# Patient Record
Sex: Male | Born: 2000 | Hispanic: No | Marital: Single | Smoking: Never smoker
Health system: Southern US, Community
[De-identification: ages and names within clinical notes are randomized; demographics above are authoritative.]

## PROBLEM LIST (undated history)

## (undated) DIAGNOSIS — F419 Anxiety disorder, unspecified: Secondary | ICD-10-CM

## (undated) DIAGNOSIS — T7840XA Allergy, unspecified, initial encounter: Secondary | ICD-10-CM

## (undated) DIAGNOSIS — K602 Anal fissure, unspecified: Secondary | ICD-10-CM

## (undated) HISTORY — DX: Anal fissure, unspecified: K60.2

## (undated) HISTORY — DX: Anxiety disorder, unspecified: F41.9

## (undated) HISTORY — PX: WISDOM TOOTH EXTRACTION: SHX21

## (undated) HISTORY — DX: Allergy, unspecified, initial encounter: T78.40XA

---
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
## 2020-05-09 ENCOUNTER — Encounter (HOSPITAL_COMMUNITY): Payer: Self-pay | Admitting: Pharmacy Technician

## 2020-05-09 ENCOUNTER — Emergency Department (HOSPITAL_COMMUNITY)
Admission: EM | Admit: 2020-05-09 | Discharge: 2020-05-10 | Disposition: A | Discharge status: Home / Self Care | Payer: Medicaid Other | Attending: Emergency Medicine | Admitting: Emergency Medicine

## 2020-05-09 ENCOUNTER — Other Ambulatory Visit: Payer: Self-pay

## 2020-05-09 DIAGNOSIS — Z79899 Other long term (current) drug therapy: Secondary | ICD-10-CM | POA: Insufficient documentation

## 2020-05-09 DIAGNOSIS — K2980 Duodenitis without bleeding: Secondary | ICD-10-CM | POA: Diagnosis not present

## 2020-05-09 DIAGNOSIS — K602 Anal fissure, unspecified: Secondary | ICD-10-CM | POA: Diagnosis not present

## 2020-05-09 DIAGNOSIS — R1084 Generalized abdominal pain: Secondary | ICD-10-CM | POA: Diagnosis present

## 2020-05-09 LAB — COMPREHENSIVE METABOLIC PANEL
ALT: 22 U/L (ref 0–44)
AST: 28 U/L (ref 15–41)
Albumin: 4.5 g/dL (ref 3.5–5.0)
Alkaline Phosphatase: 80 U/L (ref 38–126)
Anion gap: 10 (ref 5–15)
BUN: 12 mg/dL (ref 6–20)
CO2: 27 mmol/L (ref 22–32)
Calcium: 9.6 mg/dL (ref 8.9–10.3)
Chloride: 102 mmol/L (ref 98–111)
Creatinine, Ser: 0.94 mg/dL (ref 0.61–1.24)
GFR, Estimated: >60 mL/min (ref >60)
Glucose, Bld: 96 mg/dL (ref 70–99)
Potassium: 3.6 mmol/L (ref 3.5–5.1)
Sodium: 139 mmol/L (ref 135–145)
Total Bilirubin: 0.9 mg/dL (ref 0.3–1.2)
Total Protein: 7 g/dL (ref 6.5–8.1)

## 2020-05-09 LAB — CBC
HCT: 45.7 % (ref 39.0–52.0)
Hemoglobin: 15.6 g/dL (ref 13.0–17.0)
MCH: 30.8 pg (ref 26.0–34.0)
MCHC: 34.1 g/dL (ref 30.0–36.0)
MCV: 90.3 fL (ref 80.0–100.0)
Platelets: 172 10*3/uL (ref 150–400)
RBC: 5.06 MIL/uL (ref 4.22–5.81)
RDW: 12.3 % (ref 11.5–15.5)
WBC: 6.5 10*3/uL (ref 4.0–10.5)
nRBC: 0 % (ref 0.0–0.2)

## 2020-05-09 LAB — TYPE AND SCREEN
ABO/RH(D): B POS
Antibody Screen: NEGATIVE

## 2020-05-09 LAB — LIPASE, BLOOD: Lipase: 36 U/L (ref 11–51)

## 2020-05-09 NOTE — ED Triage Notes (Signed)
Pt here with reports of generalized abdominal pain all week. Pt endorses diarrhea with blood in it. Pt denies N/V or fevers.

## 2020-05-10 ENCOUNTER — Emergency Department (HOSPITAL_COMMUNITY): Payer: Medicaid Other

## 2020-05-10 MED ORDER — FAMOTIDINE 20 MG PO TABS: 20.0000 mg | ORAL_TABLET | Freq: Two times a day (BID) | ORAL | 0 refills | Status: DC

## 2020-05-10 MED ORDER — PANTOPRAZOLE SODIUM 40 MG PO TBEC
40.0000 mg | DELAYED_RELEASE_TABLET | Freq: Once | ORAL | Status: AC
  Administered 2020-05-10: 40 mg via ORAL
  Filled 2020-05-10: qty 1

## 2020-05-10 MED ORDER — FAMOTIDINE 20 MG PO TABS
20.0000 mg | ORAL_TABLET | Freq: Once | ORAL | Status: AC
  Administered 2020-05-10: 20 mg via ORAL
  Filled 2020-05-10: qty 1

## 2020-05-10 MED ORDER — PANTOPRAZOLE SODIUM 20 MG PO TBEC: 20.0000 mg | DELAYED_RELEASE_TABLET | Freq: Every day | ORAL | 0 refills | Status: DC

## 2020-05-10 MED ORDER — DOCUSATE SODIUM 100 MG PO CAPS: 100.0000 mg | ORAL_CAPSULE | Freq: Two times a day (BID) | ORAL | 0 refills | Status: DC

## 2020-05-10 MED ORDER — IOHEXOL 300 MG/ML  SOLN
100.0000 mL | Freq: Once | INTRAMUSCULAR | Status: AC | PRN
  Administered 2020-05-10: 100 mL via INTRAVENOUS

## 2020-05-10 NOTE — ED Provider Notes (Signed)
[facility] EMERGENCY DEPARTMENT Provider Note   CSN: [id number] Arrival date & time: 05/09/20  1842     History Chief Complaint  Patient presents with  . Abdominal Pain    James Phillips- Prudy Feeler is a 20 y.o. male.  20 yo M w/ 1 week of generalized abdominal pain.  Patient states that he also had some blood-streaked stools during that time.  But today the abdominal pain got slightly worse but more significantly had a bowel movement that was all blood.  States it is bright red.  No clots.  States he has a bit of rectal discomfort with bowel movements but no other associated issues.  He states he had constipation a few years ago saw GI doctor right appears x-rays were done, but no source was found.  Patient with no other history of this.  Patient is sexually active with women and does not have anal intercourse.  Patient states his constipation symptoms seem to have improved. No longer supplements fiber/miralax/etc.   Abdominal Pain      History reviewed. No pertinent past medical history.  There are no problems to display for this patient.   Past Surgical History:  Procedure Laterality Date  . WISDOM TOOTH EXTRACTION         No family history on file.  Social History   Tobacco Use  . Smoking status: Never Smoker  Substance Use Topics  . Alcohol use: Never  . Drug use: Never    Home Medications Prior to Admission medications   Medication Sig Start Date End Date Taking? Authorizing Provider  Calcium Carbonate-Vitamin D (CALCIUM-D PO) Take 1 tablet by mouth daily.   Yes [provider]  docusate sodium (COLACE) 100 MG capsule Take 1 capsule (100 mg total) by mouth every 12 (twelve) hours. 05/10/20  Yes Margeret Stachnik, Barbara Cower, MD  famotidine (PEPCID) 20 MG tablet Take 1 tablet (20 mg total) by mouth 2 (two) times daily. 05/10/20  Yes Luella Gardenhire, Barbara Cower, MD  pantoprazole (PROTONIX) 20 MG tablet Take 1 tablet (20 mg total) by mouth daily. 05/10/20  Yes Berneita Sanagustin,  Barbara Cower, MD  ibuprofen (MOTRIN IB) 200 MG tablet Take 3 tablets (600 mg total) by mouth every 8 (eight) hours as needed. 07/14/15   Burroughs, Cherre Robins, MD    Allergies    Copper-containing compounds  Review of Systems   Review of Systems  Gastrointestinal: Positive for abdominal pain.  All other systems reviewed and are negative.   Physical Exam Updated Vital Signs BP 129/78   Pulse 71   Temp 98.9 F (37.2 C)   Resp 18   SpO2 96%   Physical Exam Vitals and nursing note reviewed. Exam conducted with a chaperone present.  Constitutional:      Appearance: He is well-developed and well-nourished.  HENT:     Head: Normocephalic and atraumatic.     Mouth/Throat:     Mouth: Mucous membranes are moist.  Eyes:     Pupils: Pupils are equal, round, and reactive to light.  Cardiovascular:     Rate and Rhythm: Normal rate.  Pulmonary:     Effort: Pulmonary effort is normal. No respiratory distress.  Abdominal:     General: Abdomen is flat. There is no distension.  Genitourinary:    Rectum: Anal fissure (small, no e/o recent bleeding) present. No tenderness or external hemorrhoid.     Comments: Chaperoned by nurse tech Musculoskeletal:        General: Normal range of motion.  Cervical back: Normal range of motion.  Skin:    General: Skin is warm and dry.  Neurological:     General: No focal deficit present.     Mental Status: He is alert.     ED Results / Procedures / Treatments   Labs (all labs ordered are listed, but only abnormal results are displayed) Labs Reviewed  COMPREHENSIVE METABOLIC PANEL  CBC  LIPASE, BLOOD  POC OCCULT BLOOD, ED  TYPE AND SCREEN    EKG None  Radiology CT ABDOMEN PELVIS W CONTRAST  Result Date: 05/10/2020 CLINICAL DATA:  Diffuse abdominal pain, diarrhea, hematochezia EXAM: CT ABDOMEN AND PELVIS WITH CONTRAST TECHNIQUE: Multidetector CT imaging of the abdomen and pelvis was performed using the standard protocol following bolus  administration of intravenous contrast. CONTRAST:  OMNIPAQUE IOHEXOL 300 MG/ML  SOLN COMPARISON:  02/16/2017 FINDINGS: Lower chest: The visualized lung bases are clear bilaterally. The visualized heart and pericardium are unremarkable. Hepatobiliary: No focal liver abnormality is seen. No gallstones, gallbladder wall thickening, or biliary dilatation. Pancreas: Unremarkable Spleen: Unremarkable Adrenals/Urinary Tract: Adrenal glands are unremarkable. Kidneys are normal, without renal calculi, focal lesion, or hydronephrosis. Bladder is unremarkable. Stomach/Bowel: There is circumferential thickening and mild periduodenal inflammatory stranding involving the third and fourth portion of the duodenum as well as the proximal jejunum in the region of the ligament of Treitz compatible with changes of infectious or inflammatory duodenitis. No evidence of obstruction. No perforation. The stomach, small bowel, and large bowel are otherwise unremarkable. Appendix normal. No free intraperitoneal gas or fluid. Vascular/Lymphatic: No significant vascular findings are present. No enlarged abdominal or pelvic lymph nodes. Reproductive: Prostate is unremarkable. Other: Tiny fat containing umbilical hernia. The rectum is unremarkable. Musculoskeletal: The osseous structures are unremarkable. IMPRESSION: Circumferential thickening and mild periduodenal inflammatory stranding in keeping with changes of infectious or inflammatory duodenitis. No evidence of obstruction or perforation. Electronically Signed   By: Helyn Numbers MD   On: 05/10/2020 03:17    Procedures Procedures   Medications Ordered in ED Medications  iohexol (OMNIPAQUE) 300 MG/ML solution 100 mL (100 mLs Intravenous Contrast Given 05/10/20 0305)  pantoprazole (PROTONIX) EC tablet 40 mg (40 mg Oral Given 05/10/20 0428)  famotidine (PEPCID) tablet 20 mg (20 mg Oral Given 05/10/20 8841)    ED Course  I have reviewed the triage vital signs and the nursing  notes.  Pertinent labs & imaging results that were available during my care of the patient were reviewed by me and considered in my medical decision making (see chart for details).    MDM Rules/Calculators/A&P                         Will get ct to ensure no e/o IBD.   Ct w/ possible duodenitis. No other abnormalities. Will treat for duodenitis and anal fissure. GI fu recommended.   Final Clinical Impression(s) / ED Diagnoses Final diagnoses:  Duodenitis  Anal fissure    Rx / DC Orders ED Discharge Orders         Ordered    pantoprazole (PROTONIX) 20 MG tablet  Daily        05/10/20 0420    famotidine (PEPCID) 20 MG tablet  2 times daily        05/10/20 0420    docusate sodium (COLACE) 100 MG capsule  Every 12 hours        05/10/20 0420           Reginae Wolfrey,  Barbara Cower, MD 05/10/20 [id number]

## 2020-06-26 ENCOUNTER — Emergency Department (HOSPITAL_COMMUNITY): Payer: Medicaid Other

## 2020-06-26 ENCOUNTER — Other Ambulatory Visit: Payer: Self-pay

## 2020-06-26 ENCOUNTER — Emergency Department (HOSPITAL_COMMUNITY)
Admission: EM | Admit: 2020-06-26 | Discharge: 2020-06-26 | Disposition: A | Discharge status: Home / Self Care | Payer: Medicaid Other | Attending: Emergency Medicine | Admitting: Emergency Medicine

## 2020-06-26 ENCOUNTER — Encounter (HOSPITAL_COMMUNITY): Payer: Self-pay

## 2020-06-26 DIAGNOSIS — M546 Pain in thoracic spine: Secondary | ICD-10-CM | POA: Insufficient documentation

## 2020-06-26 DIAGNOSIS — R059 Cough, unspecified: Secondary | ICD-10-CM | POA: Insufficient documentation

## 2020-06-26 MED ORDER — NAPROXEN 500 MG PO TABS: 500.0000 mg | ORAL_TABLET | Freq: Two times a day (BID) | ORAL | 0 refills | Status: DC

## 2020-06-26 MED ORDER — METHOCARBAMOL 500 MG PO TABS: 1000.0000 mg | ORAL_TABLET | Freq: Four times a day (QID) | ORAL | 0 refills | Status: DC

## 2020-06-26 NOTE — ED Triage Notes (Signed)
Emergency Medicine Provider Triage Evaluation Note  Piotr Christopher , a 20 y.o. male  was evaluated in triage.  Pt complains of SHOB, back pain. Onset a week ago, progressively worsening.  Review of Systems  Positive: Back pain, SHOB Negative: CP, numbness, weakness  Physical Exam  BP 131/80 (BP Location: Left Arm)   Pulse 73   Temp 99.1 F (37.3 C)   Resp 17   SpO2 99%  Gen:   Awake, no distress   HEENT:  Atraumatic  Resp:  Normal effort  Cardiac:  Normal rate  Abd:   Nondistended, nontender  MSK:   Moves extremities without difficulty, TTP thoracic paraspinous right and left Neuro:  Speech clear   Medical Decision Making  Medically screening exam initiated at 4:42 PM.  Appropriate orders placed.  Kirby Cortese was informed that the remainder of the evaluation will be completed by another provider, this initial triage assessment does not replace that evaluation, and the importance of remaining in the ED until their evaluation is complete.  Clinical Impression     Jeannie Fend, PA-C 06/26/20 1643

## 2020-06-26 NOTE — ED Triage Notes (Signed)
Pt c/o back pain that worsens with movement. Pt states "it feels like someone is pushing on my chest". Pt c/o shortness of breath. Denies nausea, vomiting, or cough. Symptoms started 11 days ago.

## 2020-06-26 NOTE — ED Provider Notes (Signed)
[facility] EMERGENCY DEPARTMENT Provider Note   CSN: [id number] Arrival date & time: 06/26/20  1631     History Chief Complaint  Patient presents with  . Back Pain    James Phillips is a 20 y.o. male.  Patient presents to the emergency department today for evaluation of mid back soreness and a sensation of pressure like his back is pressing into his lungs.  Symptoms started about a week ago.  This was after he had an illness with coughing 1 to 2 weeks prior.  He denies acute injuries.  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  Denies wheezing, current cough or fevers.  He has been able to do normal activities.  Patient denies warning symptoms of back pain including: fecal incontinence, urinary retention or overflow incontinence, night sweats, waking from sleep with back pain, unexplained fevers or weight loss, h/o cancer, IVDU, recent trauma.  Patient denies risk factors for pulmonary embolism including: unilateral leg swelling, history of DVT/PE/other blood clots, use of exogenous hormones, recent immobilizations, recent surgery, recent travel (>4hr segment), malignancy, hemoptysis.          History reviewed. No pertinent past medical history.  There are no problems to display for this patient.   Past Surgical History:  Procedure Laterality Date  . WISDOM TOOTH EXTRACTION         History reviewed. No pertinent family history.  Social History   Tobacco Use  . Smoking status: Never Smoker  . Smokeless tobacco: Never Used  Vaping Use  . Vaping Use: Never used  Substance Use Topics  . Alcohol use: Not Currently  . Drug use: Yes    Types: Marijuana    Comment: occasionally    Home Medications Prior to Admission medications   Medication Sig Start Date End Date Taking? Authorizing Provider  Calcium Carbonate-Vitamin D (CALCIUM-D PO) Take 1 tablet by mouth daily.    [provider]  docusate sodium (COLACE) 100 MG capsule Take 1  capsule (100 mg total) by mouth every 12 (twelve) hours. 05/10/20   Mesner, Barbara Cower, MD  famotidine (PEPCID) 20 MG tablet Take 1 tablet (20 mg total) by mouth 2 (two) times daily. 05/10/20   Mesner, Barbara Cower, MD  ibuprofen (MOTRIN IB) 200 MG tablet Take 3 tablets (600 mg total) by mouth every 8 (eight) hours as needed. 07/14/15   Burroughs, Cherre Robins, MD  pantoprazole (PROTONIX) 20 MG tablet Take 1 tablet (20 mg total) by mouth daily. 05/10/20   Mesner, Barbara Cower, MD    Allergies    Copper-containing compounds  Review of Systems   Review of Systems  Constitutional: Negative for fever and unexpected weight change.  Respiratory: Positive for shortness of breath. Negative for cough, chest tightness and wheezing.   Cardiovascular: Negative for chest pain and leg swelling.  Gastrointestinal: Negative for constipation.       Neg for fecal incontinence  Genitourinary: Negative for difficulty urinating, flank pain and hematuria.       Negative for urinary incontinence or retention  Musculoskeletal: Positive for back pain.  Neurological: Negative for weakness and numbness.       Negative for saddle paresthesias     Physical Exam Updated Vital Signs BP 131/80 (BP Location: Left Arm)   Pulse 73   Temp 99.1 F (37.3 C)   Resp 17   SpO2 99%   Physical Exam Vitals and nursing note reviewed.  Constitutional:      Appearance: He is well-developed.  HENT:  Head: Normocephalic and atraumatic.  Eyes:     Conjunctiva/sclera: Conjunctivae normal.  Abdominal:     Palpations: Abdomen is soft.     Tenderness: There is no abdominal tenderness.  Musculoskeletal:        General: Normal range of motion.     Cervical back: Normal range of motion. No tenderness. Normal range of motion.     Thoracic back: Tenderness (Paraspinous) present. No bony tenderness.     Lumbar back: Tenderness (paraspinous) present. No bony tenderness.     Comments: No step-off noted with palpation of spine.   Skin:    General:  Skin is warm and dry.  Neurological:     Mental Status: He is alert.     Sensory: No sensory deficit.     Motor: No abnormal muscle tone.     Deep Tendon Reflexes: Reflexes are normal and symmetric.     Comments: 5/5 strength in entire lower extremities bilaterally. No sensation deficit.      ED Results / Procedures / Treatments   Labs (all labs ordered are listed, but only abnormal results are displayed) Labs Reviewed - No data to display  EKG EKG Interpretation  Date/Time:  Thursday June 26 2020 16:41:10 EDT Ventricular Rate:  73 PR Interval:  162 QRS Duration: 98 QT Interval:  352 QTC Calculation: 387 R Axis:   90 Text Interpretation: Normal sinus rhythm Rightward axis Borderline ECG Confirmed by Kristine Royal [phone]) on 06/26/2020 6:04:11 PM   Radiology DG Chest 2 View  Result Date: 06/26/2020 CLINICAL DATA:  Shortness of breath EXAM: CHEST - 2 VIEW COMPARISON:  02/16/2017 FINDINGS: The heart size and mediastinal contours are within normal limits. Both lungs are clear. The visualized skeletal structures are unremarkable. IMPRESSION: Normal study. Electronically Signed   By: Charlett Nose M.D.   On: 06/26/2020 17:34    Procedures Procedures   Medications Ordered in ED Medications - No data to display  ED Course  I have reviewed the triage vital signs and the nursing notes.  Pertinent labs & imaging results that were available during my care of the patient were reviewed by me and considered in my medical decision making (see chart for details).  7:00 PM Patient seen and examined. Reviewed CXR and EKG.   Vital signs reviewed and are as follows: Vitals:   06/26/20 1638  BP: 131/80  Pulse: 73  Resp: 17  Temp: 99.1 F (37.3 C)  SpO2: 99%    Patient counseled on proper use of muscle relaxant medication.  They were told not to drink alcohol, drive any vehicle, or do any dangerous activities while taking this medication.  Patient verbalized  understanding.  Patient urged to follow-up with PCP if pain does not improve with treatment and rest or if pain becomes recurrent. Urged to return with worsening severe pain, loss of bowel or bladder control, trouble walking.   The patient verbalizes understanding and agrees with the plan.    MDM Rules/Calculators/A&P                          Patient with back pain and some respiratory symptoms.  Chest x-ray, EKG, vital signs are all reassuring.  Pain does seem reproducible with movement and position.  Seems musculoskeletal in nature.  No red flags for low back pain or for DVT/PE.  Patient is in no distress.  We will treat conservatively as above.  Encourage PCP follow-up if not improving.  Final Clinical  Impression(s) / ED Diagnoses Final diagnoses:  Acute bilateral thoracic back pain    Rx / DC Orders ED Discharge Orders         Ordered    methocarbamol (ROBAXIN) 500 MG tablet  4 times daily        06/26/20 1903    naproxen (NAPROSYN) 500 MG tablet  2 times daily        06/26/20 1903           Renne Crigler, Cordelia Poche 06/26/20 1904    Wynetta Fines, MD 06/26/20 2317

## 2020-06-26 NOTE — Discharge Instructions (Signed)
Please read and follow all provided instructions.  Your diagnoses today include:  1. Acute bilateral thoracic back pain     Tests performed today include:  Vital signs - see below for your results today  Medications prescribed:   Robaxin (methocarbamol) - muscle relaxer medication  DO NOT drive or perform any activities that require you to be awake and alert because this medicine can make you drowsy.    Naproxen - anti-inflammatory pain medication  Do not exceed 500mg  naproxen every 12 hours, take with food  You have been prescribed an anti-inflammatory medication or NSAID. Take with food. Take smallest effective dose for the shortest duration needed for your pain. Stop taking if you experience stomach pain or vomiting.   Take any prescribed medications only as directed.  Home care instructions:   Follow any educational materials contained in this packet  Please rest, use ice or heat on your back for the next several days  Do not lift, push, pull anything more than 10 pounds for the next week  Follow-up instructions: Please follow-up with your primary care provider in the next 1 week for further evaluation of your symptoms.   Return instructions:  SEEK IMMEDIATE MEDICAL ATTENTION IF YOU HAVE:  New numbness, tingling, weakness, or problem with the use of your arms or legs  Severe back pain not relieved with medications  Loss control of your bowels or bladder  Increasing pain in any areas of the body (such as chest or abdominal pain)  Shortness of breath, dizziness, or fainting.   Worsening nausea (feeling sick to your stomach), vomiting, fever, or sweats  Any other emergent concerns regarding your health   Additional Information:  Your vital signs today were: BP 131/80 (BP Location: Left Arm)   Pulse 73   Temp 99.1 F (37.3 C)   Resp 17   SpO2 99%  If your blood pressure (BP) was elevated above 135/85 this visit, please have this repeated by your doctor  within one month. --------------
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
## 2020-10-16 ENCOUNTER — Other Ambulatory Visit: Payer: Self-pay

## 2020-10-16 ENCOUNTER — Ambulatory Visit (INDEPENDENT_AMBULATORY_CARE_PROVIDER_SITE_OTHER): Payer: Medicaid Other | Admitting: Family Medicine

## 2020-10-16 ENCOUNTER — Encounter: Payer: Self-pay | Admitting: Family Medicine

## 2020-10-16 ENCOUNTER — Ambulatory Visit (INDEPENDENT_AMBULATORY_CARE_PROVIDER_SITE_OTHER): Payer: Medicaid Other

## 2020-10-16 VITALS — BP 120/76 | HR 66 | Ht 70.0 in | Wt 204.8 lb

## 2020-10-16 DIAGNOSIS — G8929 Other chronic pain: Secondary | ICD-10-CM | POA: Diagnosis not present

## 2020-10-16 DIAGNOSIS — M545 Low back pain, unspecified: Secondary | ICD-10-CM | POA: Diagnosis not present

## 2020-10-16 DIAGNOSIS — Z1159 Encounter for screening for other viral diseases: Secondary | ICD-10-CM

## 2020-10-16 DIAGNOSIS — Z114 Encounter for screening for human immunodeficiency virus [HIV]: Secondary | ICD-10-CM | POA: Diagnosis present

## 2020-10-16 DIAGNOSIS — Z23 Encounter for immunization: Secondary | ICD-10-CM

## 2020-10-16 NOTE — Assessment & Plan Note (Signed)
No sciatica.  No midline tenderness.  Patient not in any pain at this time but normally experiences pain at the upper lumbar region in the musculature more on the right side.  He has been getting good benefit from lidocaine patches.  He is interested in learning some strengthening exercises to help improve the area as this has bothered him for 3 or 4 years.  We will place referral for physical therapy.  Patient plans to asked them to ensure that he will not have much expense for physical therapy.  If this is the case he is going to let me know and I can demonstrate additional exercises that he may try.  Follow-up as needed

## 2020-10-16 NOTE — Patient Instructions (Signed)
Will screen for HIV and hepatitis C as is recommended.  Will provide referral for physical therapy.  If you have any difficulty going to physical therapy or prefer other means or have any other issues with your back please let us know.

## 2020-10-17 LAB — HCV AB W REFLEX TO QUANT PCR: HCV Ab: <0.1 s/co ratio (ref 0.0–0.9)

## 2020-10-17 LAB — HCV INTERPRETATION

## 2020-10-17 LAB — HIV ANTIBODY (ROUTINE TESTING W REFLEX): HIV Screen 4th Generation wRfx: NONREACTIVE

## 2020-10-24 ENCOUNTER — Ambulatory Visit: Payer: Medicaid Other | Admitting: Physical Therapy

## 2020-10-25 ENCOUNTER — Encounter: Payer: Self-pay | Admitting: Physical Therapy

## 2020-10-25 ENCOUNTER — Other Ambulatory Visit: Payer: Self-pay

## 2020-10-25 ENCOUNTER — Ambulatory Visit: Payer: Medicaid Other | Attending: Family Medicine | Admitting: Physical Therapy

## 2020-10-25 DIAGNOSIS — M545 Low back pain, unspecified: Secondary | ICD-10-CM | POA: Diagnosis not present

## 2020-10-25 DIAGNOSIS — G8929 Other chronic pain: Secondary | ICD-10-CM | POA: Insufficient documentation

## 2020-10-25 DIAGNOSIS — M62830 Muscle spasm of back: Secondary | ICD-10-CM | POA: Insufficient documentation
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
## 2020-10-31 ENCOUNTER — Ambulatory Visit: Payer: Medicaid Other | Admitting: Physical Therapy

## 2020-11-07 ENCOUNTER — Ambulatory Visit: Payer: Medicaid Other | Admitting: Physical Therapy

## 2020-11-15 ENCOUNTER — Other Ambulatory Visit: Payer: Self-pay

## 2020-11-15 ENCOUNTER — Encounter: Payer: Self-pay | Admitting: Physical Therapy

## 2020-11-15 ENCOUNTER — Ambulatory Visit: Payer: Medicaid Other | Attending: Family Medicine | Admitting: Physical Therapy

## 2020-11-15 DIAGNOSIS — G8929 Other chronic pain: Secondary | ICD-10-CM | POA: Insufficient documentation

## 2020-11-15 DIAGNOSIS — M545 Low back pain, unspecified: Secondary | ICD-10-CM | POA: Diagnosis present

## 2020-11-15 DIAGNOSIS — M62830 Muscle spasm of back: Secondary | ICD-10-CM | POA: Diagnosis present

## 2020-11-15 NOTE — Therapy (Signed)
[facility] [address] [place], [place], [address] Phone: [phone]   Fax:  [phone]  Physical Therapy Treatment  Patient Details  Name: James Phillips MRN: [id number] Date of Birth: 11/22/2000 Referring Provider (PT): Carney Living, MD   Encounter Date: 11/15/2020   PT End of Session - 11/15/20 0905     Visit Number 2    Number of Visits 7    Date for PT Re-Evaluation 12/06/20    Authorization Type UHC managed MCD    PT Start Time 0902    PT Stop Time 0943    PT Time Calculation (min) 41 min    Activity Tolerance Patient tolerated treatment well    Behavior During Therapy [facility] for tasks assessed/performed             History reviewed. No pertinent past medical history.  Past Surgical History:  Procedure Laterality Date   WISDOM TOOTH EXTRACTION      There were no vitals filed for this visit.   Subjective Assessment - 11/15/20 0905     Subjective "I am doing the exercises and the pain is getting better today at a dull 3/10 and the motion is improving."    Diagnostic tests 02/16/2017 IMPRESSION:  Negative exam.    Patient Stated Goals To be able to do anything I want without being limited    Currently in Pain? Yes    Pain Score 3     Pain Location Back    Pain Orientation Right    Pain Descriptors / Indicators Aching    Pain Type Chronic pain    Pain Onset More than a month ago    Pain Frequency Intermittent    Aggravating Factors  prolonged standing    Pain Relieving Factors bending forward and stretching                               [facility] Adult PT Treatment/Exercise:  Therapeutic Exercise: Elliptical L1 ramp L1 x 5 min Hamstring stretch 3 x 30 sec PNF contract/ relax SLR with quad set 2 x 12 - (cues to keep ankle DF) R Low back stretch SKTC 2 x 30 sec Lower trunk rotation x 20  Dead bug 1 x 10 holding 10 sec - (tactile cues for proper form) Seated on  green physioball - Anterior pelvic tilt with abdominal draw in maneuver 1 x 10 holding 5 second Seated marching alternating L/R 1 x 20 Palloff press 2 x 12 BTB bil   Manual Therapy:  LAD LLE grade V with cavitation noted Trigger point release along the  Rlumbar paraspinals ( how to use a ball against the wall)   NOT PERFORMED TODAY:          PT Education - 11/15/20 0924     Education Details Reviewed and updated HEP to include dead bug and SKTC stretch. reviewed posture in sitting and beneifts of positoining.              PT Short Term Goals - 10/25/20 5009       PT SHORT TERM GOAL #1   Title pt to be IND with inital HEP    Baseline no previous HEP    Time 3    Period Weeks    Status New    Target Date 11/15/20               PT Long Term Goals -  10/25/20 0854       PT LONG TERM GOAL #1   Title pt to maintain his functional trunk mobility but noting no pain in all planes for improvement of condition    Baseline see flowsheet    Time 6    Period Weeks    Status New    Target Date 12/06/20      PT LONG TERM GOAL #2   Title pt to be able to verbalize/ demo efficient posture and lifting mechanics to reduce and prevent low back pain    Baseline no knowledge of posture    Time 6    Period Weeks    Status New    Target Date 12/06/20      PT LONG TERM GOAL #3   Title pt to be able to walk/ stand with no report of limitations or pain for functional endurance for ADLS and school related tasks.    Baseline pt noted intemrittent pain with prolonged standing that occurs occasionally    Time 6    Period Weeks    Status New    Target Date 12/06/20      PT LONG TERM GOAL #4   Title pt to be IND with advanced HEP to maint ain and progress current LOF IND.    Baseline no previous HEP    Time 6    Period Weeks    Status New    Target Date 12/06/20                   Plan - 11/15/20 9323     Clinical Impression Statement pt arrives to PT  following his evaluation fnoting improvement in pain and consistency with his HEP. worked on hamstring stretching and R hip flexor activation. continued working core strengthening on unstable surface and reviewed seated posture to reduced tension on the back. end of session he noted the pain dropped to 2/10. plan to assess next session if more PT is needed.    PT Treatment/Interventions ADLs/Self Care Home Management;Cryotherapy;Electrical Stimulation;Iontophoresis 4mg /ml Dexamethasone;Moist Heat;Traction;Ultrasound;Therapeutic activities;Therapeutic exercise;Balance training;Neuromuscular re-education;Patient/family education;Manual techniques;Passive range of motion;Dry needling;Spinal Manipulations;Taping    PT Next Visit Plan review/ update HEP PRN, R posterior SIJ positioning, LAD for R LLE, hamstring stretching, hip flexor activation, STW for R lumbar paraspinals, posture education. core strengthening.    PT Home Exercise Plan [name] - seated and supine hamstring stretch, SLR, LTR, PPT (in supine), dead bug and SKTC,             Patient will benefit from skilled therapeutic intervention in order to improve the following deficits and impairments:  Improper body mechanics, Increased muscle spasms, Postural dysfunction, Pain, Decreased activity tolerance, Decreased endurance  Visit Diagnosis: Chronic bilateral low back pain, unspecified whether sciatica present  Muscle spasm of back     Problem List Patient Active Problem List   Diagnosis Date Noted   Low back pain 10/16/2020   12/16/2020 PT, DPT, LAT, ATC  11/15/20  9:45 AM      [facility] [address] [place], [place], [place] Phone: [phone]   Fax:  [phone]  Name: James Phillips MRN: Iline Oven Date of Birth: [birth year]

## 2020-11-21 ENCOUNTER — Ambulatory Visit: Payer: Medicaid Other | Admitting: Physical Therapy

## 2020-11-21 ENCOUNTER — Other Ambulatory Visit: Payer: Self-pay

## 2020-11-21 ENCOUNTER — Encounter: Payer: Self-pay | Admitting: Physical Therapy

## 2020-11-21 DIAGNOSIS — G8929 Other chronic pain: Secondary | ICD-10-CM

## 2020-11-21 DIAGNOSIS — M545 Low back pain, unspecified: Secondary | ICD-10-CM

## 2020-11-21 DIAGNOSIS — M62830 Muscle spasm of back: Secondary | ICD-10-CM

## 2020-11-21 NOTE — Therapy (Signed)
[facility] [address] [place], [place], [address] Phone: [phone]   Fax:  [phone]  Physical Therapy Treatment  Patient Details  Name: James Phillips MRN: [id number] Date of Birth: 09-16-2000 Referring Provider (PT): Carney Living, MD   Encounter Date: 11/21/2020   PT End of Session - 11/21/20 0936     Visit Number 3    Number of Visits 7    Date for PT Re-Evaluation 12/06/20    Authorization Type UHC managed MCD    PT Start Time 0934    PT Stop Time 1012    PT Time Calculation (min) 38 min    Activity Tolerance Patient tolerated treatment well    Behavior During Therapy [facility] for tasks assessed/performed             History reviewed. No pertinent past medical history.  Past Surgical History:  Procedure Laterality Date   WISDOM TOOTH EXTRACTION      There were no vitals filed for this visit.   Subjective Assessment - 11/21/20 0937     Subjective " I am pleased with how everthing. I am able to do everything I want to do and the pain is manage."    Patient Stated Goals To be able to do anything I want without being limited    Currently in Pain? Yes    Pain Location Back    Pain Orientation Right    Pain Descriptors / Indicators Aching    Pain Type Chronic pain    Pain Onset More than a month ago    Pain Frequency Intermittent    Aggravating Factors  unsure    Pain Relieving Factors bending forward.                         [facility] Adult PT Treatment/Exercise:   Therapeutic Exercise: Elliptical L1 ramp L x 5 min Hamstring stretch 2 x 30 sec with  strap R Low back stretch SKTC 2 x 30 sec SLR with quad set 2 x 15 -  with 3# weight(cues to keep ankle DF) Dead bug 1 x 5 holding 10 sec. Then progress with red physioball on stomach  2 x 20 holding 10 sec with UE/LE contralateral movement Lower trunk rotation x 20  Seated isometric ball squeeze 2 x 10 holding 5 sec ea.     Manual Therapy: LAD LLE grade V with cavitation noted MTPR focused on the R glute med x 3      NOT PERFORMED TODAY: Seated on green physioball - Anterior pelvic tilt with abdominal draw in maneuver 1 x 10 holding 5 second Seated marching alternating L/R 1 x 20 Palloff press 2 x 12 BTB bil  Trigger point release along the  R lumbar paraspinals ( how to use a ball against the wall)                 PT Short Term Goals - 10/25/20 0853       PT SHORT TERM GOAL #1   Title pt to be IND with inital HEP    Baseline no previous HEP    Time 3    Period Weeks    Status New    Target Date 11/15/20               PT Long Term Goals - 10/25/20 0854       PT LONG TERM GOAL #1   Title pt to maintain his  functional trunk mobility but noting no pain in all planes for improvement of condition    Baseline see flowsheet    Time 6    Period Weeks    Status New    Target Date 12/06/20      PT LONG TERM GOAL #2   Title pt to be able to verbalize/ demo efficient posture and lifting mechanics to reduce and prevent low back pain    Baseline no knowledge of posture    Time 6    Period Weeks    Status New    Target Date 12/06/20      PT LONG TERM GOAL #3   Title pt to be able to walk/ stand with no report of limitations or pain for functional endurance for ADLS and school related tasks.    Baseline pt noted intemrittent pain with prolonged standing that occurs occasionally    Time 6    Period Weeks    Status New    Target Date 12/06/20      PT LONG TERM GOAL #4   Title pt to be IND with advanced HEP to maint ain and progress current LOF IND.    Baseline no previous HEP    Time 6    Period Weeks    Status New    Target Date 12/06/20                   Plan - 11/21/20 1012     Clinical Impression Statement pt notes pain today at 4/10 but reports overall he is very pleased with his progress and is able to do more without feeling limited by pain. continued  working on R SIJ with hamstring stretching and SLR, progressing to adductor activation and STW along the glute med. end of session he noted a reduction in pain. plan to see pt back in 3 week  assess progress without PT and determine if more PT is warranted.    PT Treatment/Interventions ADLs/Self Care Home Management;Cryotherapy;Electrical Stimulation;Iontophoresis 4mg /ml Dexamethasone;Moist Heat;Traction;Ultrasound;Therapeutic activities;Therapeutic exercise;Balance training;Neuromuscular re-education;Patient/family education;Manual techniques;Passive range of motion;Dry needling;Spinal Manipulations;Taping    PT Next Visit Plan review/ update HEP PRN, R posterior SIJ positioning, LAD for R LLE, hamstring stretching, hip flexor activation, STW for R lumbar paraspinals, posture education. core strengthening.    PT Home Exercise Plan [name] - seated and supine hamstring stretch, SLR, LTR, PPT (in supine), dead bug and SKTC,    Consulted and Agree with Plan of Care Patient             Patient will benefit from skilled therapeutic intervention in order to improve the following deficits and impairments:  Improper body mechanics, Increased muscle spasms, Postural dysfunction, Pain, Decreased activity tolerance, Decreased endurance  Visit Diagnosis: Chronic bilateral low back pain, unspecified whether sciatica present  Muscle spasm of back     Problem List Patient Active Problem List   Diagnosis Date Noted   Low back pain 10/16/2020    12/16/2020 PT, DPT, LAT, ATC  11/21/20  10:16 AM     [facility] [address] [place], [place], [place] Phone: [phone]   Fax:  [phone]  Name: James Phillips MRN: Iline Oven Date of Birth: 01/10/2001

## 2020-11-26 DIAGNOSIS — F4310 Post-traumatic stress disorder, unspecified: Secondary | ICD-10-CM | POA: Diagnosis not present

## 2020-12-03 DIAGNOSIS — F4310 Post-traumatic stress disorder, unspecified: Secondary | ICD-10-CM | POA: Diagnosis not present

## 2020-12-10 ENCOUNTER — Ambulatory Visit: Payer: Medicaid Other | Admitting: Physical Therapy

## 2020-12-10 DIAGNOSIS — F4310 Post-traumatic stress disorder, unspecified: Secondary | ICD-10-CM | POA: Diagnosis not present

## 2020-12-15 ENCOUNTER — Other Ambulatory Visit: Payer: Self-pay

## 2020-12-15 ENCOUNTER — Ambulatory Visit: Payer: Medicaid Other | Attending: Family Medicine | Admitting: Physical Therapy

## 2020-12-15 ENCOUNTER — Encounter: Payer: Self-pay | Admitting: Physical Therapy

## 2020-12-15 DIAGNOSIS — G8929 Other chronic pain: Secondary | ICD-10-CM | POA: Diagnosis present

## 2020-12-15 DIAGNOSIS — M62830 Muscle spasm of back: Secondary | ICD-10-CM | POA: Diagnosis present

## 2020-12-15 DIAGNOSIS — M545 Low back pain, unspecified: Secondary | ICD-10-CM | POA: Diagnosis not present

## 2020-12-15 NOTE — Patient Instructions (Addendum)
Sleeping on Back  Place pillow under knees. A pillow with cervical support and a roll around waist are also helpful. Copyright  VHI. All rights reserved.  Sleeping on Side Place pillow between knees. Use cervical support under neck and a roll around waist as needed. Copyright  VHI. All rights reserved.   Sleeping on Stomach   If this is the only desirable sleeping position, place pillow under lower legs, and under stomach or chest as needed.  Posture - Sitting   Sit upright, head facing forward. Try using a roll to support lower back. Keep shoulders relaxed, and avoid rounded back. Keep hips level with knees. Avoid crossing legs for long periods. Stand to Sit / Sit to Stand   To sit: Bend knees to lower self onto front edge of chair, then scoot back on seat. To stand: Reverse sequence by placing one foot forward, and scoot to front of seat. Use rocking motion to stand up.   Work Height and Reach  Ideal work height is no more than 2 to 4 inches below elbow level when standing, and at elbow level when sitting. Reaching should be limited to arm's length, with elbows slightly bent.  Bending  Bend at hips and knees, not back. Keep feet shoulder-width apart.    Posture - Standing   Good posture is important. Avoid slouching and forward head thrust. Maintain curve in low back and align ears over shoul- ders, hips over ankles.  Alternating Positions   Alternate tasks and change positions frequently to reduce fatigue and muscle tension. Take rest breaks. Computer Work   Position work to Programmer, multimedia. Use proper work and seat height. Keep shoulders back and down, wrists straight, and elbows at right angles. Use chair that provides full back support. Add footrest and lumbar roll as needed.  Getting Into / Out of Car  Lower self onto seat, scoot back, then bring in one leg at a time. Reverse sequence to get out.  Dressing  Lie on back to pull socks or slacks over feet, or sit  and bend leg while keeping back straight.    Housework - Sink  Place one foot on ledge of cabinet under sink when standing at sink for prolonged periods.   Pushing / Pulling  Pushing is preferable to pulling. Keep back in proper alignment, and use leg muscles to do the work.  Deep Squat   Squat and lift with both arms held against upper trunk. Tighten stomach muscles without holding breath. Use smooth movements to avoid jerking.  Avoid Twisting   Avoid twisting or bending back. Pivot around using foot movements, and bend at knees if needed when reaching for articles.  Carrying Luggage   Distribute weight evenly on both sides. Use a cart whenever possible. Do not twist trunk. Move body as a unit.   Lifting Principles Maintain proper posture and head alignment. Slide object as close as possible before lifting. Move obstacles out of the way. Test before lifting; ask for help if too heavy. Tighten stomach muscles without holding breath. Use smooth movements; do not jerk. Use legs to do the work, and pivot with feet. Distribute the work load symmetrically and close to the center of trunk. Push instead of pull whenever possible.   Ask For Help   Ask for help and delegate to others when possible. Coordinate your movements when lifting together, and maintain the low back curve.  Log Roll   Lying on back, bend left knee and place left  arm across chest. Roll all in one movement to the right. Reverse to roll to the left. Always move as one unit. Housework - Sweeping  Use long-handled equipment to avoid stooping.   Housework - Wiping  Position yourself as close as possible to reach work surface. Avoid straining your back.  Laundry - Unloading Wash   To unload small items at bottom of washer, lift leg opposite to arm being used to reach.  Gardening - Raking  Move close to area to be raked. Use arm movements to do the work. Keep back straight and avoid twisting.      Cart  When reaching into cart with one arm, lift opposite leg to keep back straight.   Getting Into / Out of Bed  Lower self to lie down on one side by raising legs and lowering head at the same time. Use arms to assist moving without twisting. Bend both knees to roll onto back if desired. To sit up, start from lying on side, and use same move-ments in reverse. Housework - Vacuuming  Hold the vacuum with arm held at side. Step back and forth to move it, keeping head up. Avoid twisting.   Laundry - Armed forces training and education officer so that bending and twisting can be avoided.   Laundry - Unloading Dryer  Squat down to reach into clothes dryer or use a reacher.  Gardening - Weeding / Psychiatric nurse or Kneel. Knee pads may be helpful.                   Access Code: [email or web address] URL: [email or web address] Date: 12/15/2020 Prepared by: Lulu Riding  Exercises Seated Hamstring Stretch - 1 x daily - 7 x weekly - 2 reps - 2 sets - 30 hold Supine Hamstring Stretch with Strap - 1 x daily - 7 x weekly - 2 reps - 2 sets - 30 hold Supine Active Straight Leg Raise - 1 x daily - 7 x weekly - 3 sets - 15 reps Supine Posterior Pelvic Tilt - 1 x daily - 7 x weekly - 3 sets - 15 reps Lower Trunk Rotation - 1 x daily - 7 x weekly - 2 sets - 10 reps Isometric Dead Bug - 1 x daily - 7 x weekly - 2 sets - 10 reps - 10 seconds hold Supine Single Knee to Chest (Mirrored) - 2 x daily - 7 x weekly - 2 sets - 2 reps - 30 hold Seated Isometric Hip Adduction with Ball - 1 x daily - 7 x weekly - 2 sets - 10 reps - 5 sec hold Sidelying Hip Abduction - 1 x daily - 7 x weekly - 3 sets - 12 reps

## 2020-12-15 NOTE — Therapy (Signed)
[place], [place], [address] Phone: [phone]   Fax:  [phone]  Physical Therapy Treatment / Discharge note  Patient Details  Name: James Phillips MRN: [id number] Date of Birth: 21-Oct-2000 Referring Provider (PT): Lind Covert, MD   Encounter Date: 12/15/2020   PT End of Session - 12/15/20 1425     Visit Number 4    Number of Visits 7    Date for PT Re-Evaluation 12/15/20    Authorization Type UHC managed MCD    PT Start Time 1595    PT Stop Time 3967    PT Time Calculation (min) 25 min    Activity Tolerance Patient tolerated treatment well    Behavior During Therapy [facility] for tasks assessed/performed             History reviewed. No pertinent past medical history.  Past Surgical History:  Procedure Laterality Date   WISDOM TOOTH EXTRACTION      There were no vitals filed for this visit.   Subjective Assessment - 12/15/20 1425     Subjective "I am feeling great, only issue iw sleeping occasionally."    Patient Stated Goals To be able to do anything I want without being limited    Currently in Pain? No/denies                [facility] PT Assessment - 12/15/20 0001       Assessment   Medical Diagnosis Chronic low back pain, unspecified back pain laterality, unspecified whether sciatica present (M54.50, G89.29)    Referring Provider (PT) Lind Covert, MD      Observation/Other Assessments   Focus on Therapeutic Outcomes (FOTO)  MCD      AROM   Overall AROM  Within functional limits for tasks performed                                    PT Education - 12/15/20 1426     Education Details Reviewed HEP and provided posture education and Barista handout. 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    Person(s) Educated Patient    Methods Explanation;Verbal cues;Handout     Comprehension Verbalized understanding;Verbal cues required              PT Short Term Goals - 12/15/20 1427       PT SHORT TERM GOAL #1   Title pt to be IND with inital HEP    Status Achieved               PT Long Term Goals - 12/15/20 1427       PT LONG TERM GOAL #1   Title pt to maintain his functional trunk mobility but noting no pain in all planes for improvement of condition    Period Weeks    Status Achieved      PT LONG TERM GOAL #2   Title pt to be able to verbalize/ demo efficient posture and lifting mechanics to reduce and prevent low back pain    Period Weeks    Status Achieved      PT LONG TERM GOAL #3   Title pt to be able to walk/ stand with no report of limitations or pain for functional endurance for ADLS and school related tasks.    Period Weeks    Status Achieved  PT LONG TERM GOAL #4   Title pt to be IND with advanced HEP to maint ain and progress current LOF IND.    Period Weeks    Status Achieved                   Plan - 12/15/20 1442     Clinical Impression James Phillips has made excellent progress with physical therapy and additionally reports no pain today. he does report some soreness while laying at night but overall has done very well. reviewed HEP and provided posture handout. He met all goals today and is able to maintain and progress current LOF IND and will be discharged from PT today.    PT Treatment/Interventions ADLs/Self Care Home Management;Cryotherapy;Electrical Stimulation;Iontophoresis 33m/ml Dexamethasone;Moist Heat;Traction;Ultrasound;Therapeutic activities;Therapeutic exercise;Balance training;Neuromuscular re-education;Patient/family education;Manual techniques;Passive range of motion;Dry needling;Spinal Manipulations;Taping    PT Next Visit Plan D/C today    PT Home Exercise Plan [name] - seated and supine hamstring stretch, SLR, LTR, PPT (in supine), dead bug and SKTC, posture handout.    Consulted  and Agree with Plan of Care Patient             Patient will benefit from skilled therapeutic intervention in order to improve the following deficits and impairments:  Improper body mechanics, Increased muscle spasms, Postural dysfunction, Pain, Decreased activity tolerance, Decreased endurance  Visit Diagnosis: Chronic bilateral low back pain, unspecified whether sciatica present  Muscle spasm of back     Problem List Patient Active Problem List   Diagnosis Date Noted   Low back pain 10/16/2020   KStarr LakePT, DPT, LAT, ATC  12/15/20  2:46 PM      [facility]GSpen[address] [place] [address]Phone: [phone]  Fax:  [phone] Name: James ZemanMRN: [id number] of Birth: [birth year]     PHYSICAL THERAPY DISCHARGE SUMMARY  Visits from Start of Care: 4  Current functional level related to goals / functional outcomes: See goals,   Remaining deficits: See assessment in note   Education / Equipment: HEP, Theraband, posture, lifting mechancis   Patient agrees to discharge. Patient goals were met. Patient is being discharged due to meeting the stated rehab goals.   [name] PT, DPT, LAT, ATC  12/15/20  2:47 PM

## 2020-12-17 DIAGNOSIS — F4310 Post-traumatic stress disorder, unspecified: Secondary | ICD-10-CM | POA: Diagnosis not present

## 2020-12-25 ENCOUNTER — Encounter: Payer: Medicaid Other | Admitting: Physical Therapy

## 2020-12-31 DIAGNOSIS — F4310 Post-traumatic stress disorder, unspecified: Secondary | ICD-10-CM | POA: Diagnosis not present

## 2021-01-07 DIAGNOSIS — F4310 Post-traumatic stress disorder, unspecified: Secondary | ICD-10-CM | POA: Diagnosis not present

## 2021-01-14 DIAGNOSIS — F4310 Post-traumatic stress disorder, unspecified: Secondary | ICD-10-CM | POA: Diagnosis not present

## 2021-01-21 DIAGNOSIS — F4310 Post-traumatic stress disorder, unspecified: Secondary | ICD-10-CM | POA: Diagnosis not present

## 2021-01-28 DIAGNOSIS — F4310 Post-traumatic stress disorder, unspecified: Secondary | ICD-10-CM | POA: Diagnosis not present

## 2021-02-04 DIAGNOSIS — F4310 Post-traumatic stress disorder, unspecified: Secondary | ICD-10-CM | POA: Diagnosis not present

## 2021-02-11 DIAGNOSIS — F4310 Post-traumatic stress disorder, unspecified: Secondary | ICD-10-CM | POA: Diagnosis not present

## 2021-02-18 DIAGNOSIS — F4310 Post-traumatic stress disorder, unspecified: Secondary | ICD-10-CM | POA: Diagnosis not present

## 2021-02-25 DIAGNOSIS — F4310 Post-traumatic stress disorder, unspecified: Secondary | ICD-10-CM | POA: Diagnosis not present

## 2021-03-04 DIAGNOSIS — F4310 Post-traumatic stress disorder, unspecified: Secondary | ICD-10-CM | POA: Diagnosis not present

## 2021-03-11 DIAGNOSIS — F4310 Post-traumatic stress disorder, unspecified: Secondary | ICD-10-CM | POA: Diagnosis not present

## 2021-03-18 DIAGNOSIS — F4310 Post-traumatic stress disorder, unspecified: Secondary | ICD-10-CM | POA: Diagnosis not present

## 2021-03-25 DIAGNOSIS — F4310 Post-traumatic stress disorder, unspecified: Secondary | ICD-10-CM | POA: Diagnosis not present

## 2021-04-01 DIAGNOSIS — F4310 Post-traumatic stress disorder, unspecified: Secondary | ICD-10-CM | POA: Diagnosis not present

## 2021-04-08 DIAGNOSIS — F4310 Post-traumatic stress disorder, unspecified: Secondary | ICD-10-CM | POA: Diagnosis not present

## 2021-04-29 DIAGNOSIS — F4310 Post-traumatic stress disorder, unspecified: Secondary | ICD-10-CM | POA: Diagnosis not present

## 2021-05-06 DIAGNOSIS — F4310 Post-traumatic stress disorder, unspecified: Secondary | ICD-10-CM | POA: Diagnosis not present

## 2021-05-13 DIAGNOSIS — F4310 Post-traumatic stress disorder, unspecified: Secondary | ICD-10-CM | POA: Diagnosis not present

## 2021-05-20 DIAGNOSIS — F4310 Post-traumatic stress disorder, unspecified: Secondary | ICD-10-CM | POA: Diagnosis not present

## 2021-05-27 DIAGNOSIS — F4310 Post-traumatic stress disorder, unspecified: Secondary | ICD-10-CM | POA: Diagnosis not present

## 2021-06-03 DIAGNOSIS — F4310 Post-traumatic stress disorder, unspecified: Secondary | ICD-10-CM | POA: Diagnosis not present

## 2021-06-10 DIAGNOSIS — F4310 Post-traumatic stress disorder, unspecified: Secondary | ICD-10-CM | POA: Diagnosis not present

## 2021-06-17 DIAGNOSIS — F4310 Post-traumatic stress disorder, unspecified: Secondary | ICD-10-CM | POA: Diagnosis not present

## 2021-06-19 DIAGNOSIS — H35413 Lattice degeneration of retina, bilateral: Secondary | ICD-10-CM | POA: Diagnosis not present

## 2021-06-24 DIAGNOSIS — F4310 Post-traumatic stress disorder, unspecified: Secondary | ICD-10-CM | POA: Diagnosis not present

## 2021-07-01 DIAGNOSIS — F4310 Post-traumatic stress disorder, unspecified: Secondary | ICD-10-CM | POA: Diagnosis not present

## 2021-07-06 ENCOUNTER — Encounter: Payer: Self-pay | Admitting: Family Medicine

## 2021-07-06 ENCOUNTER — Ambulatory Visit (INDEPENDENT_AMBULATORY_CARE_PROVIDER_SITE_OTHER): Payer: Medicaid Other | Admitting: Family Medicine

## 2021-07-06 VITALS — BP 120/75 | HR 71 | Ht 70.0 in | Wt 201.0 lb

## 2021-07-06 DIAGNOSIS — R194 Change in bowel habit: Secondary | ICD-10-CM

## 2021-07-06 DIAGNOSIS — J302 Other seasonal allergic rhinitis: Secondary | ICD-10-CM | POA: Diagnosis not present

## 2021-07-06 MED ORDER — FLUTICASONE PROPIONATE 50 MCG/ACT NA SUSP: 2.0000 | Freq: Every day | NASAL | 6 refills | Status: AC

## 2021-07-06 NOTE — Progress Notes (Signed)
? ? ?SUBJECTIVE:  ? ?CHIEF COMPLAINT / HPI:  ? ?"Seasonal allergies": ?21 year old male present for the above.  Symptoms include sneezing, itchy eyes, nasal congestion.  He is currently tried no meds but remembers needing allergy meds when he was younger. ? ?Request GI referral: ?He states about 3 years ago he had a referral for GI due to rectal bleeding. He saw GI who did some lab work and was planned to follow up with them but his insurance changed and he was unable to get back in with them. He denies any rectal bleeding since then but requests getting back in with them to make sure nothing was missed. He states he thinks he is lactose intolerant but is not sure. He tried some lactose free milks and it improved his symptoms a bit. He states he has 5 bowel movements per day on average and had some abdominal pain/cramping before each bowel movement which improves after. He states he has had these symptoms for years. He states he does have anxiety at baseline and see therapy but thinks he has had no improvement in symptoms when his anxiety is under better control. No fevers, no night sweats.  ? ?PERTINENT  PMH / PSH: Anxiety ? ?OBJECTIVE:  ? ?BP 120/75   Pulse 71   Ht 5\' 10"  (1.778 m)   Wt 201 lb (91.2 kg)   SpO2 97%   BMI 28.84 kg/m?   ? ?General: NAD, pleasant, able to participate in exam. ?HEENT: Boggy nasal turbinates, no cervical lymphadenopathy ?Cardiac: RRR, no murmurs. ?Respiratory: CTAB, normal effort, No wheezes, rales or rhonchi ?Abdomen: Bowel sounds are present, no abdominal discomfort to palpation ?Skin: warm and dry, no rashes noted ?Neuro: alert, no obvious focal deficits ?Psych: Normal affect and mood ? ?ASSESSMENT/PLAN:  ? ?Seasonal allergies: ?We will start with Flonase.  Recommended consideration for second-generation antihistamine if Flonase does not help.  Low concern for infectious etiology given his symptoms. ? ?Frequent bowel movements: ?Patient previously followed with GI about 3 years  ago which she states was due to some rectal bleeding which is since resolved.  He states that he had initial visit with GI but his insurance changed and was not able to follow-up with them.  He request getting back in with GI.  He states that he has had no further bouts of the rectal bleeding but he throughout his life has had frequent bowel movements which she states are of soft to normal consistency and happen about 5 times per day.  He has tried to eliminate lactose milk which she said has improved the symptoms somewhat.  He also endorses abdominal pain which happened right before his bowel movement is resolved after.  He does have a baseline history of anxiety but does not think his symptoms improve with his anxiety been under better control.  He request a referral for GI.  Differential can include lactose intolerance versus IBS.  With a history of rectal bleeding IBD is a possibility but seems less likely given has not had any in 3 years and continues to have his other symptoms.  Unlikely any acute issue with his symptoms going on for "years".  He has no abdominal pain on palpation today.  Denies fevers or night sweats.  We will place the referral for GI per his request.  I did discuss initiating some fiber supplementation as a trial and discussed that this is something that we could see him back for if he would like but he did request  GI referral nonetheless. ? ?Jackelyn Poling, DO ?[facility]  ? ? ? ?

## 2021-07-06 NOTE — Patient Instructions (Signed)
For your allergies I am prescribing Flonase nasal spray.  I recommend you use 2 sprays in each nose each day.  This should show some improvement in about 2 weeks.  If it does not continue with the Flonase and add Zyrtec over-the-counter to it daily. ? ? ?I placed the referral for GI and they should reach out to you in the next 1 to 2 weeks.  In the meantime you can consider adding a fiber supplement to your diet to see if it improves your symptoms. ?

## 2021-07-08 DIAGNOSIS — F4310 Post-traumatic stress disorder, unspecified: Secondary | ICD-10-CM | POA: Diagnosis not present

## 2021-07-15 DIAGNOSIS — F4310 Post-traumatic stress disorder, unspecified: Secondary | ICD-10-CM | POA: Diagnosis not present

## 2021-07-22 DIAGNOSIS — F4310 Post-traumatic stress disorder, unspecified: Secondary | ICD-10-CM | POA: Diagnosis not present

## 2021-07-29 DIAGNOSIS — F4310 Post-traumatic stress disorder, unspecified: Secondary | ICD-10-CM | POA: Diagnosis not present

## 2021-08-05 DIAGNOSIS — F4310 Post-traumatic stress disorder, unspecified: Secondary | ICD-10-CM | POA: Diagnosis not present

## 2021-08-11 ENCOUNTER — Encounter: Payer: Self-pay | Admitting: Family Medicine

## 2021-08-11 ENCOUNTER — Encounter: Payer: Self-pay | Admitting: *Deleted

## 2021-08-12 DIAGNOSIS — F4310 Post-traumatic stress disorder, unspecified: Secondary | ICD-10-CM | POA: Diagnosis not present

## 2021-08-17 NOTE — Progress Notes (Deleted)
    SUBJECTIVE:   CHIEF COMPLAINT / HPI:   "Vein and leg pain": ***  PERTINENT  PMH / PSH: ***  OBJECTIVE:   There were no vitals taken for this visit. ***  General: NAD, pleasant, able to participate in exam Respiratory: No respiratory distress MSK: *** Psych: Normal affect and mood  ASSESSMENT/PLAN:   No problem-specific Assessment & Plan notes found for this encounter.       Jackelyn Poling, DO [facility]

## 2021-08-18 ENCOUNTER — Ambulatory Visit: Payer: Medicaid Other | Admitting: Family Medicine

## 2021-08-20 DIAGNOSIS — F4310 Post-traumatic stress disorder, unspecified: Secondary | ICD-10-CM | POA: Diagnosis not present

## 2021-08-27 DIAGNOSIS — F4310 Post-traumatic stress disorder, unspecified: Secondary | ICD-10-CM | POA: Diagnosis not present

## 2021-09-03 ENCOUNTER — Encounter: Payer: Self-pay | Admitting: Student

## 2021-09-03 ENCOUNTER — Other Ambulatory Visit (INDEPENDENT_AMBULATORY_CARE_PROVIDER_SITE_OTHER): Payer: Medicaid Other

## 2021-09-03 ENCOUNTER — Ambulatory Visit (INDEPENDENT_AMBULATORY_CARE_PROVIDER_SITE_OTHER): Payer: Medicaid Other | Admitting: Nurse Practitioner

## 2021-09-03 ENCOUNTER — Ambulatory Visit (INDEPENDENT_AMBULATORY_CARE_PROVIDER_SITE_OTHER): Payer: Medicaid Other | Admitting: Student

## 2021-09-03 ENCOUNTER — Encounter: Payer: Self-pay | Admitting: Nurse Practitioner

## 2021-09-03 VITALS — BP 116/71 | HR 71 | Ht 69.0 in | Wt 198.0 lb

## 2021-09-03 VITALS — BP 122/70 | HR 88 | Resp 98 | Ht 69.0 in | Wt 199.0 lb

## 2021-09-03 DIAGNOSIS — M25561 Pain in right knee: Secondary | ICD-10-CM

## 2021-09-03 DIAGNOSIS — K625 Hemorrhage of anus and rectum: Secondary | ICD-10-CM

## 2021-09-03 DIAGNOSIS — R197 Diarrhea, unspecified: Secondary | ICD-10-CM | POA: Diagnosis not present

## 2021-09-03 DIAGNOSIS — K529 Noninfective gastroenteritis and colitis, unspecified: Secondary | ICD-10-CM

## 2021-09-03 DIAGNOSIS — F4310 Post-traumatic stress disorder, unspecified: Secondary | ICD-10-CM | POA: Diagnosis not present

## 2021-09-03 DIAGNOSIS — M79606 Pain in leg, unspecified: Secondary | ICD-10-CM | POA: Diagnosis not present

## 2021-09-03 DIAGNOSIS — M25562 Pain in left knee: Secondary | ICD-10-CM | POA: Diagnosis not present

## 2021-09-03 HISTORY — DX: Hemorrhage of anus and rectum: K62.5

## 2021-09-03 LAB — CBC WITH DIFFERENTIAL/PLATELET
Basophils Absolute: 0 10*3/uL (ref 0.0–0.1)
Basophils Relative: 0.4 % (ref 0.0–3.0)
Eosinophils Absolute: 0 10*3/uL (ref 0.0–0.7)
Eosinophils Relative: 0.8 % (ref 0.0–5.0)
HCT: 45.3 % (ref 39.0–52.0)
Hemoglobin: 15.5 g/dL (ref 13.0–17.0)
Lymphocytes Relative: 30.5 % (ref 12.0–46.0)
Lymphs Abs: 1.6 10*3/uL (ref 0.7–4.0)
MCHC: 34.3 g/dL (ref 30.0–36.0)
MCV: 89.3 fl (ref 78.0–100.0)
Monocytes Absolute: 0.4 10*3/uL (ref 0.1–1.0)
Monocytes Relative: 8.3 % (ref 3.0–12.0)
Neutro Abs: 3.2 10*3/uL (ref 1.4–7.7)
Neutrophils Relative %: 60 % (ref 43.0–77.0)
Platelets: 155 10*3/uL (ref 150.0–400.0)
RBC: 5.08 Mil/uL (ref 4.22–5.81)
RDW: 12.5 % (ref 11.5–15.5)
WBC: 5.4 10*3/uL (ref 4.0–10.5)

## 2021-09-03 LAB — TSH: TSH: 1.87 u[IU]/mL (ref 0.35–5.50)

## 2021-09-03 LAB — C-REACTIVE PROTEIN: CRP: <1 mg/dL (ref 0.5–20.0)

## 2021-09-03 MED ORDER — NA SULFATE-K SULFATE-MG SULF 17.5-3.13-1.6 GM/177ML PO SOLN: 1.0000 | Freq: Once | ORAL | 0 refills | Status: AC

## 2021-09-03 MED ORDER — DICLOFENAC SODIUM 1 % EX GEL: 2.0000 g | Freq: Four times a day (QID) | CUTANEOUS | 0 refills | Status: DC

## 2021-09-03 MED ORDER — DICYCLOMINE HCL 10 MG PO CAPS: 10.0000 mg | ORAL_CAPSULE | Freq: Three times a day (TID) | ORAL | 1 refills | Status: DC | PRN

## 2021-09-03 NOTE — Progress Notes (Signed)
Addendum: Reviewed and agree with assessment and management plan. Verdun Rackley M, MD  

## 2021-09-03 NOTE — Patient Instructions (Addendum)
If you are age 21 or older, your body mass index should be between 23-30. Your Body mass index is 29.39 kg/m. If this is out of the aforementioned range listed, please consider follow up with your Primary Care Provider.  If you are age 18 or younger, your body mass index should be between 19-25. Your Body mass index is 29.39 kg/m. If this is out of the aformentioned range listed, please consider follow up with your Primary Care Provider.   ________________________________________________________  The [facility] providers would like to encourage you to use [facility] to communicate with providers for non-urgent requests or questions.  Due to long hold times on the telephone, sending your provider a message by [facility] may be a faster and more efficient way to get a response.  Please allow 48 business hours for a response.  Please remember that this is for non-urgent requests.  _______________________________________________________  Bonita Quin have been scheduled for an endoscopy and colonoscopy. Please follow the written instructions given to you at your visit today. Please pick up your prep supplies at the pharmacy within the next 1-3 days. If you use inhalers (even only as needed), please bring them with you on the day of your procedure.  Your provider has requested that you go to the basement level for lab work before leaving today. Press "B" on the elevator. The lab is located at the first door on the left as you exit the elevator.  Due to recent changes in healthcare laws, you may see the results of your imaging and laboratory studies on MyChart before your provider has had a chance to review them.  We understand that in some cases there may be results that are confusing or concerning to you. Not all laboratory results come back in the same time frame and the provider may be waiting for multiple results in order to interpret others.  Please give [place] 48 hours in order for your provider to thoroughly review  all the results before contacting the office for clarification of your results.     1) FURTHER RECOMMENDATIONS WILL BE DETERMINED AFTER EGD AND COLONOSCOPY COMPLETED  2) DRINK 8 GLASSES (64 OUNCES OF WATER DAILY). EAT A LOW FIBER DIET.   3) CONTACT OUR OFFICE IF YOUR SYMPTOMS WORSEN   4) TAKE DICYCLOMINE 10MG  ONE TAB THIRTY MINUTES BEFORE MEALS OR EVERY 8 HOURS AS NEEDED FOR ABDOMINAL PAIN/CRAMPING   It was a pleasure to see you today!  Thank you for trusting me with your gastrointestinal care!

## 2021-09-03 NOTE — Patient Instructions (Addendum)
It was great to see you today! Thank you for choosing [facility] for your primary care. James Phillips was seen for leg pain   Continue with compression socks and elevating legs when able  Try to use Tylenol 325 or 500 mg every 3 or 4 hours as needed for pain  Continue with the knee exercises as well daily    No orders of the defined types were placed in this encounter.  No orders of the defined types were placed in this encounter.   If you haven't already, sign up for My Chart to have easy access to your labs results, and communication with your primary care physician.   You should return to our clinic No follow-ups on file.  I recommend that you always bring your medications to each appointment as this makes it easy to ensure you are on the correct medications and helps [place] not miss refills when you need them.  Please arrive 15 minutes before your appointment to ensure smooth check in process.  We appreciate your efforts in making this happen.  Please call the clinic at [phone] if your symptoms worsen or you have any concerns.  Thank you for allowing me to participate in your care, James Phillips [employer]

## 2021-09-03 NOTE — Progress Notes (Signed)
09/03/2021 Governor Raygoza [id number] 09/07/2000   CHIEF COMPLAINT: Diarrhea, rectal bleeding  HISTORY OF PRESENT ILLNESS: James Phillips is a 21 year old male with a past medical history of anxiety, chronic diarrhea and an anal fissure in 2018.  Past wisdom teeth extraction. He presents to our office today as referred by Dr. Talbert Cage and Dr. Lurline Del for further evaluation regarding frequent bowel movements with rectal bleeding.  He has a history of chronic diarrhea with rectal bleeding which started in 2018.  He was seen by a pediatric gastroenterologist at [facility] 11/12/2016.  At that time, he also noted having fecal leakage which was thought to be due to chronic constipation.  He was diagnosed with an anal fissure, prescribed Miralax. Celiac serology was negative, TSH and CRP levels were normal.  He was advised to follow-up in the GI clinic which was not done due to a change in his health insurance.  He has chronic diarrhea, he passes 3-5 loose to watery diarrhea bowel movements daily.  He intermittently passes bright red blood with his bowel movements which comes and goes.  He last passed blood per the rectum January or February 2023.  He cannot recall the last time he passed a solid stool.  He passes nonbloody rectum per the mucus every other day.  He has occasional tenesmus.  He experiences lower abdominal pain R > L most days and is worse after eating and decreases after he passes a bowel movement.  No specific food triggers.  He sometimes has nausea at the same time he passes a BM.  No NSAID use.  No antibiotics within the past 6 months.  He takes Lactaid with any dairy product. No known family history of celiac disease or IBD.  No fever, sweats or chills.  His weight is stable.  In review of his epic records, he presented to the ED 05/2020 with generalized abdominal pain with streaks of bright red blood in his bowel movements.  An abdominal/pelvic CT scan with  contrast showed circumferential thickening and mild periduodenal inflammatory stranding suggestive of duodenitis without evidence of perforation or obstruction.  At that time, he was assessed to have a small fissure.  He was prescribed PPI and famotidine for possible duodenitis and Colace.  He denies having any dysphagia or heartburn.  No upper abdominal pain.  He reported passing black tarry stools for 1 week about 1 year ago.  He cannot recall if he took any Pepto-Bismol during this time.  No further black stools since then.     Latest Ref Rng & Units 05/09/2020    7:02 PM 02/16/2017    7:36 PM 10/23/2015   12:41 AM  CBC  WBC 4.0 - 10.5 K/uL 6.5  9.5  4.6   Hemoglobin 13.0 - 17.0 g/dL 15.6  15.2  12.7   Hematocrit 39.0 - 52.0 % 45.7  42.6  36.2   Platelets 150 - 400 K/uL 172  177  93        Latest Ref Rng & Units 05/09/2020    7:02 PM 02/16/2017    7:36 PM 10/23/2015   12:41 AM  CMP  Glucose 70 - 99 mg/dL 96  95  125   BUN 6 - 20 mg/dL 12  7  <5   Creatinine 0.61 - 1.24 mg/dL 0.94  0.80  0.86   Sodium 135 - 145 mmol/L 139  137  135   Potassium 3.5 - 5.1 mmol/L 3.6  3.6  3.2  Chloride 98 - 111 mmol/L 102  102  105   CO2 22 - 32 mmol/L 27  27  25    Calcium 8.9 - 10.3 mg/dL 9.6  9.6  8.7   Total Protein 6.5 - 8.1 g/dL 7.0  7.2  6.0   Total Bilirubin 0.3 - 1.2 mg/dL 0.9  0.5  0.7   Alkaline Phos 38 - 126 U/L 80  87  74   AST 15 - 41 U/L 28  23  69   ALT 0 - 44 U/L 22  14  50      CTAP 05/10/2020: FINDINGS: Lower chest: The visualized lung bases are clear bilaterally. The visualized heart and pericardium are unremarkable.   Hepatobiliary: No focal liver abnormality is seen. No gallstones, gallbladder wall thickening, or biliary dilatation.   Pancreas: Unremarkable   Spleen: Unremarkable   Adrenals/Urinary Tract: Adrenal glands are unremarkable. Kidneys are normal, without renal calculi, focal lesion, or hydronephrosis. Bladder is unremarkable.   Stomach/Bowel: There is  circumferential thickening and mild periduodenal inflammatory stranding involving the third and fourth portion of the duodenum as well as the proximal jejunum in the region of the ligament of Treitz compatible with changes of infectious or inflammatory duodenitis. No evidence of obstruction. No perforation. The stomach, small bowel, and large bowel are otherwise unremarkable. Appendix normal. No free intraperitoneal gas or fluid.   Vascular/Lymphatic: No significant vascular findings are present. No enlarged abdominal or pelvic lymph nodes.   Reproductive: Prostate is unremarkable.   Other: Tiny fat containing umbilical hernia. The rectum is unremarkable.   Musculoskeletal: The osseous structures are unremarkable.   IMPRESSION: Circumferential thickening and mild periduodenal inflammatory stranding in keeping with changes of infectious or inflammatory duodenitis. No evidence of obstruction or perforation.     Past Surgical History:  Procedure Laterality Date   WISDOM TOOTH EXTRACTION     Social History: He is single. Nonsmoker. Remote marijuana use. He drinks a few shots of liquor on the weekends.   Family History: Maternal grandmother had breast cancer. Paternal grandfather had lung cancer. Father with diabetes. Brother with asthma. Sister with IBS and thyroid disorder.   Allergies  Allergen Reactions   Copper-Containing Compounds       Outpatient Encounter Medications as of 09/03/2021  Medication Sig   fluticasone (FLONASE) 50 MCG/ACT nasal spray Place 2 sprays into both nostrils daily.   [DISCONTINUED] Calcium Carbonate-Vitamin D (CALCIUM-D PO) Take 1 tablet by mouth daily.   No facility-administered encounter medications on file as of 09/03/2021.     REVIEW OF SYSTEMS:  Gen: Denies fever, sweats or chills. No weight loss.  CV: Denies chest pain, palpitations or edema. Resp: Denies cough, shortness of breath of hemoptysis.  GI: See HPI.   GU : Denies urinary  burning, blood in urine, increased urinary frequency or incontinence. MS: Denies joint pain, muscles aches or weakness. Derm: Denies rash, itchiness, skin lesions or unhealing ulcers. Psych: + Anxiety.  Heme: Denies bruising, easy bleeding. Neuro:  Denies headaches, dizziness or paresthesias. Endo:  Denies any problems with DM, thyroid or adrenal function.  PHYSICAL EXAM: BP 122/70   Pulse 88   Resp (!) 98   Ht 5\' 9"  (1.753 m)   Wt 199 lb (90.3 kg)   BMI 29.39 kg/m   General: 21 year old male in no acute distress. Head: Normocephalic and atraumatic. Eyes:  Sclerae non-icteric, conjunctive pink. Ears: Normal auditory acuity. Mouth: Dentition intact. No ulcers or lesions.  Neck: Supple, no lymphadenopathy or thyromegaly.  Lungs: Clear bilaterally to auscultation without wheezes, crackles or rhonchi. Heart: Regular rate and rhythm. No murmur, rub or gallop appreciated.  Abdomen: Soft, nontender, non distended. No masses. No hepatosplenomegaly. Normoactive bowel sounds x 4 quadrants.  Rectal: No external hemorrhoids.  No obvious anal fissure.  Small internal hemorrhoids palpated without prolapse.  Rectal mucosa within reach of felt boggy.  No blood.  Sheralyn Boatman CMA present during exam. Musculoskeletal: Symmetrical with no gross deformities. Skin: Warm and dry. No rash or lesions on visible extremities. Extremities: No edema. Neurological: Alert oriented x 4, no focal deficits.  Psychological:  Alert and cooperative. Normal mood and affect.  ASSESSMENT AND PLAN:  65) 21 year old male with chronic lower abdominal pain and diarrhea with intermittent rectal bleeding +/- tenesmus -Colonoscopy to rule out IBD -CBC, CMP, CRP and TSH -Dicyclomine 10 mg 1 p.o. 3 times daily as needed -Low residue diet, drink 64 ounces of water daily -Patient to contact office if symptoms worsen  2) Duodenitis with inflammation to the proximal jejunum per CT 05/2020.  Patient endorsed passing black tarry stools  for 1 week approximately 1 year ago without recurrence.  Negative celiac serology in 2018. -EGD to rule out PUD, H. Pylori, UGI Crohn's disease and celiac disease   Further recommendations to be determined after the above evaluation completed   CC:  Jackelyn Poling, DO

## 2021-09-03 NOTE — Progress Notes (Cosign Needed)
  SUBJECTIVE:   CHIEF COMPLAINT / HPI:   Leg Pain:  Bilateral at inner knees, throbs with overuse when at work and picking up boxes, moving around  Tried compression socks for some relief  Dad has similar pains  Saw GI today, will receive colonoscopy and EGD in future per recommendations.  Large varicose vein behind right knee  No clicking or popping in knee  Does not prevent from exercising  No swelling or redness  No rashes   Asked about Atomoxetine or other nonstimulant medication for ADHD as his therapist has mentioned it.     PERTINENT  PMH / PSH: Chronic diarrhea,   No past medical history on file.  OBJECTIVE:  BP 116/71   Pulse 71   Ht 5\' 9"  (1.753 m)   Wt 198 lb (89.8 kg)   BMI 29.24 kg/m   General: NAD, pleasant, able to participate in exam Cardiac: RRR, no murmurs auscultated Respiratory: CTAB, normal WOB Extremities: warm and well perfused, no edema or cyanosis, varicose vein behind the right knee  Skin: warm and dry, no rashes noted KneeL Full flexion and extension without TTP, normal gait with no crepitus, DP pulses strong with no edema  Neuro: alert, no obvious focal deficits, speech normal Psych: Normal affect and mood  ASSESSMENT/PLAN:  Rectal bleeding No NSAIDS  Chronic diarrhea Sees GI  Knee pain R>L; likely overuse injury and inflammation. Continue with knee exercises and Voltaren gel for relief. Tylenol as well can be used PRN. No abnormalities on knee exam. Return if continues. Can use compression socks and elevate legs as well.   Gave information about atomoxetine to patient. He reports that his therapist would give a recommendation for ADHD medication. I see no formal diagnosis but instructed him to bring this recommendation in.    No orders of the defined types were placed in this encounter.  Meds ordered this encounter  Medications   diclofenac Sodium (VOLTAREN) 1 % GEL    Sig: Apply 2 g topically 4 (four) times daily.    Dispense:   150 g    Refill:  0   Return if symptoms worsen or fail to improve. Dragon Thrush 

## 2021-09-04 DIAGNOSIS — M25569 Pain in unspecified knee: Secondary | ICD-10-CM | POA: Insufficient documentation

## 2021-09-04 LAB — COMPLETE METABOLIC PANEL WITH GFR
AG Ratio: 1.8 (calc) (ref 1.0–2.5)
ALT: 15 U/L (ref 9–46)
AST: 26 U/L (ref 10–40)
Albumin: 4.7 g/dL (ref 3.6–5.1)
Alkaline phosphatase (APISO): 81 U/L (ref 36–130)
BUN: 11 mg/dL (ref 7–25)
CO2: 27 mmol/L (ref 20–32)
Calcium: 9.9 mg/dL (ref 8.6–10.3)
Chloride: 104 mmol/L (ref 98–110)
Creat: 0.94 mg/dL (ref 0.60–1.24)
Globulin: 2.6 g/dL (calc) (ref 1.9–3.7)
Glucose, Bld: 89 mg/dL (ref 65–99)
Potassium: 4 mmol/L (ref 3.5–5.3)
Sodium: 140 mmol/L (ref 135–146)
Total Bilirubin: 0.6 mg/dL (ref 0.2–1.2)
Total Protein: 7.3 g/dL (ref 6.1–8.1)
eGFR: 118 mL/min/{1.73_m2} (ref >=60)

## 2021-09-04 NOTE — Assessment & Plan Note (Signed)
No NSAIDS

## 2021-09-04 NOTE — Assessment & Plan Note (Signed)
R>L; likely overuse injury and inflammation. Continue with knee exercises and Voltaren gel for relief. Tylenol as well can be used PRN. No abnormalities on knee exam. Return if continues. Can use compression socks and elevate legs as well.

## 2021-09-04 NOTE — Assessment & Plan Note (Addendum)
Sees GI, was seen by GI same day as our appt. Has scheduled endoscopy and colonoscopy to rule out IBD or other possible causes. Will f/u GI recommendations

## 2021-09-11 ENCOUNTER — Ambulatory Visit: Payer: Medicaid Other | Admitting: Internal Medicine

## 2021-09-17 DIAGNOSIS — F4310 Post-traumatic stress disorder, unspecified: Secondary | ICD-10-CM | POA: Diagnosis not present

## 2021-09-24 DIAGNOSIS — F4310 Post-traumatic stress disorder, unspecified: Secondary | ICD-10-CM | POA: Diagnosis not present

## 2021-10-01 DIAGNOSIS — F4310 Post-traumatic stress disorder, unspecified: Secondary | ICD-10-CM | POA: Diagnosis not present

## 2021-10-12 ENCOUNTER — Encounter: Payer: Medicaid Other | Admitting: Internal Medicine

## 2021-10-14 ENCOUNTER — Encounter: Payer: Self-pay | Admitting: Internal Medicine

## 2021-10-15 ENCOUNTER — Encounter: Payer: Self-pay | Admitting: Certified Registered Nurse Anesthetist

## 2021-10-21 ENCOUNTER — Ambulatory Visit (AMBULATORY_SURGERY_CENTER): Payer: Medicaid Other | Admitting: Internal Medicine

## 2021-10-21 ENCOUNTER — Encounter: Payer: Self-pay | Admitting: Internal Medicine

## 2021-10-21 VITALS — BP 111/61 | HR 60 | Temp 99.1°F | Resp 20 | Ht 69.0 in | Wt 199.0 lb

## 2021-10-21 DIAGNOSIS — R933 Abnormal findings on diagnostic imaging of other parts of digestive tract: Secondary | ICD-10-CM | POA: Diagnosis not present

## 2021-10-21 DIAGNOSIS — K529 Noninfective gastroenteritis and colitis, unspecified: Secondary | ICD-10-CM

## 2021-10-21 DIAGNOSIS — R197 Diarrhea, unspecified: Secondary | ICD-10-CM | POA: Diagnosis not present

## 2021-10-21 DIAGNOSIS — K2950 Unspecified chronic gastritis without bleeding: Secondary | ICD-10-CM | POA: Diagnosis not present

## 2021-10-21 DIAGNOSIS — K625 Hemorrhage of anus and rectum: Secondary | ICD-10-CM

## 2021-10-21 DIAGNOSIS — K2960 Other gastritis without bleeding: Secondary | ICD-10-CM

## 2021-10-21 DIAGNOSIS — R935 Abnormal findings on diagnostic imaging of other abdominal regions, including retroperitoneum: Secondary | ICD-10-CM | POA: Diagnosis not present

## 2021-10-21 MED ORDER — SODIUM CHLORIDE 0.9 % IV SOLN: 500.0000 mL | Freq: Once | INTRAVENOUS | Status: DC

## 2021-10-21 NOTE — Op Note (Signed)
[facility] Patient Name: James Phillips Procedure Date: 10/21/2021 2:15 PM MRN: [id number] Endoscopist: Beverley Fiedler , MD Age: 21 Referring MD:  Date of Birth: 2000/12/07 Gender: Male Account #: [id number] Procedure:                Colonoscopy Indications:              Chronic diarrhea, intermittent rectal bleeding Medicines:                Monitored Anesthesia Care Procedure:                Pre-Anesthesia Assessment:                           - Prior to the procedure, a History and Physical                            was performed, and patient medications and                            allergies were reviewed. The patient's tolerance of                            previous anesthesia was also reviewed. The risks                            and benefits of the procedure and the sedation                            options and risks were discussed with the patient.                            All questions were answered, and informed consent                            was obtained. Prior Anticoagulants: The patient has                            taken no previous anticoagulant or antiplatelet                            agents. ASA Grade Assessment: I - A normal, healthy                            patient. After reviewing the risks and benefits,                            the patient was deemed in satisfactory condition to                            undergo the procedure.                           After obtaining informed consent, the colonoscope  was passed under direct vision. Throughout the                            procedure, the patient's blood pressure, pulse, and                            oxygen saturations were monitored continuously. The                            CF [id number] #[id number] was introduced through the anus                            and advanced to the terminal ileum. The colonoscopy                            was performed  without difficulty. The patient                            tolerated the procedure well. The quality of the                            bowel preparation was excellent. The terminal                            ileum, ileocecal valve, appendiceal orifice, and                            rectum were photographed. Scope In: 2:26:00 PM Scope Out: 2:35:18 PM Scope Withdrawal Time: 0 hours 7 minutes 46 seconds  Total Procedure Duration: 0 hours 9 minutes 18 seconds  Findings:                 The digital rectal exam was normal.                           The terminal ileum appeared normal.                           The entire examined colon appeared normal on direct                            and retroflexion views.                           Biopsies for histology were taken with a cold                            forceps from the right colon and left colon for                            evaluation of microscopic colitis. Complications:            No immediate complications. Estimated Blood Loss:     Estimated blood loss: none. Impression:               - The examined  portion of the ileum was normal.                           - The entire examined colon is normal on direct and                            retroflexion views.                           - Biopsies were taken with a cold forceps from the                            right colon and left colon for evaluation of                            microscopic colitis. Recommendation:           - Patient has a contact number available for                            emergencies. The signs and symptoms of potential                            delayed complications were discussed with the                            patient. Return to normal activities tomorrow.                            Written discharge instructions were provided to the                            patient.                           - Resume previous diet.                           -  Continue present medications. If biopsies                            negative then consider rifaximin for IBS-D and if                            not long term benefit could consider Viberzi.                           - Await pathology results.                           - Office follow-up with me in 3 months or so.                           - No recommendation at this time regarding repeat  colonoscopy due to young age. Beverley Fiedler, MD 10/21/2021 2:53:55 PM This report has been signed electronically.

## 2021-10-21 NOTE — Progress Notes (Signed)
GASTROENTEROLOGY PROCEDURE H&P NOTE   Primary Care Physician: Tiffany Kocher, MD    Reason for Procedure:  Lower abdominal pain with chronic diarrhea and rectal bleeding, abnormal CT scan suggestive of duodenitis  Plan:    EGD and colonoscopy  Patient is appropriate for endoscopic procedure(s) in the ambulatory ([facility]) setting.  The nature of the procedure, as well as the risks, benefits, and alternatives were carefully and thoroughly reviewed with the patient. Ample time for discussion and questions allowed. The patient understood, was satisfied, and agreed to proceed.     HPI: James Phillips is a 21 y.o. male who presents for EGD and colonoscopy.  Medical history as below.  Tolerated the prep.  No recent chest pain or shortness of breath.  No abdominal pain today.  Past Medical History:  Diagnosis Date   Allergy     Past Surgical History:  Procedure Laterality Date   WISDOM TOOTH EXTRACTION      Prior to Admission medications   Medication Sig Start Date End Date Taking? Authorizing Provider  diclofenac Sodium (VOLTAREN) 1 % GEL Apply 2 g topically 4 (four) times daily. 09/03/21   Alfredo Martinez, MD  dicyclomine (BENTYL) 10 MG capsule Take 1 capsule (10 mg total) by mouth 3 (three) times daily as needed for spasms. 09/03/21   Arnaldo Natal, NP  fluticasone (FLONASE) 50 MCG/ACT nasal spray Place 2 sprays into both nostrils daily. 07/06/21   Jackelyn Poling, DO    Current Outpatient Medications  Medication Sig Dispense Refill   diclofenac Sodium (VOLTAREN) 1 % GEL Apply 2 g topically 4 (four) times daily. 150 g 0   dicyclomine (BENTYL) 10 MG capsule Take 1 capsule (10 mg total) by mouth 3 (three) times daily as needed for spasms. 30 capsule 1   fluticasone (FLONASE) 50 MCG/ACT nasal spray Place 2 sprays into both nostrils daily. 16 g 6   Current Facility-Administered Medications  Medication Dose Route Frequency Provider Last Rate Last Admin   0.9 %   sodium chloride infusion  500 mL Intravenous Once Brylin Stopper, [name], MD        Allergies as of 10/21/2021 - Review Complete 10/21/2021  Allergen Reaction Noted   Copper-containing compounds  02/12/2015    Family History  Problem Relation Age of Onset   Diabetes Father    Asthma Brother    Colon cancer Neg Hx    Stomach cancer Neg Hx    Esophageal cancer Neg Hx    Pancreatic cancer Neg Hx    Rectal cancer Neg Hx     Social History   Socioeconomic History   Marital status: Single    Spouse name: Not on file   Number of children: Not on file   Years of education: Not on file   Highest education level: Not on file  Occupational History   Not on file  Tobacco Use   Smoking status: Never   Smokeless tobacco: Never  Vaping Use   Vaping Use: Never used  Substance and Sexual Activity   Alcohol use: Not Currently   Drug use: Yes    Types: Marijuana    Comment: last use 10/14/21 - one joint   Sexual activity: Not on file  Other Topics Concern   Not on file  Social History Narrative   Not on file   Social Determinants of Health   Financial Resource Strain: Not on file  Food Insecurity: Not on file  Transportation Needs: Not on file  Physical Activity: Not  on file  Stress: Not on file  Social Connections: Not on file  Intimate Partner Violence: Not on file    Physical Exam: Vital signs in last 24 hours: @BP  108/62   Pulse 72   Temp 99.1 F (37.3 C)   Ht 5\' 9"  (1.753 m)   Wt 199 lb (90.3 kg)   SpO2 96%   BMI 29.39 kg/m  GEN: NAD EYE: Sclerae anicteric ENT: MMM CV: Non-tachycardic Pulm: CTA b/l GI: Soft, NT/ND NEURO:  Alert & Oriented x 3   , MD [facility]  10/21/2021 2:04 PM

## 2021-10-21 NOTE — Patient Instructions (Signed)
Await biopsy results.   My office will contact you to schedule an office visit with me in 3 months or so.   YOU HAD AN ENDOSCOPIC PROCEDURE TODAY AT THE [facility]:   Refer to the procedure report that was given to you for any specific questions about what was found during the examination.  If the procedure report does not answer your questions, please call your gastroenterologist to clarify.  If you requested that your care partner not be given the details of your procedure findings, then the procedure report has been included in a sealed envelope for you to review at your convenience later.  YOU SHOULD EXPECT: Some feelings of bloating in the abdomen. Passage of more gas than usual.  Walking can help get rid of the air that was put into your GI tract during the procedure and reduce the bloating. If you had a lower endoscopy (such as a colonoscopy or flexible sigmoidoscopy) you may notice spotting of blood in your stool or on the toilet paper. If you underwent a bowel prep for your procedure, you may not have a normal bowel movement for a few days.  Please Note:  You might notice some irritation and congestion in your nose or some drainage.  This is from the oxygen used during your procedure.  There is no need for concern and it should clear up in a day or so.  SYMPTOMS TO REPORT IMMEDIATELY:  Following lower endoscopy (colonoscopy or flexible sigmoidoscopy):  Excessive amounts of blood in the stool  Significant tenderness or worsening of abdominal pains  Swelling of the abdomen that is new, acute  Fever of 100F or higher  Following upper endoscopy (EGD)  Vomiting of blood or coffee ground material  New chest pain or pain under the shoulder blades  Painful or persistently difficult swallowing  New shortness of breath  Fever of 100F or higher  Black, tarry-looking stools  For urgent or emergent issues, a gastroenterologist can be reached at any hour by calling [phone]  [phone]. Do not use MyChart messaging for urgent concerns.    DIET:  We do recommend a small meal at first, but then you may proceed to your regular diet.  Drink plenty of fluids but you should avoid alcoholic beverages for 24 hours.  ACTIVITY:  You should plan to take it easy for the rest of today and you should NOT DRIVE or use heavy machinery until tomorrow (because of the sedation medicines used during the test).    FOLLOW UP: Our staff will call the number listed on your records the next business day following your procedure.  We will call around 7:15- 8:00 am to check on you and address any questions or concerns that you may have regarding the information given to you following your procedure. If we do not reach you, we will leave a message.  If you develop any symptoms (ie: fever, flu-like symptoms, shortness of breath, cough etc.) before then, please call [phone].  If you test positive for Covid 19 in the 2 weeks post procedure, please call and report this information to [place].    If any biopsies were taken you will be contacted by phone or by letter within the next 1-3 weeks.  Please call us at [phone] if you have not heard about the biopsies in 3 weeks.    SIGNATURES/CONFIDENTIALITY: You and/or your care partner have signed paperwork which will be entered into your electronic medical record.  These signatures attest to  the fact that that the information above on your After Visit Summary has been reviewed and is understood.  Full responsibility of the confidentiality of this discharge information lies with you and/or your care-partner.

## 2021-10-21 NOTE — Op Note (Signed)
[facility] Patient Name: James Phillips Procedure Date: 10/21/2021 2:14 PM MRN: [id number] Endoscopist: Beverley Fiedler , MD Age: 21 Referring MD:  Date of Birth: 01/08/2001 Gender: Male Account #: [id number] Procedure:                Upper GI endoscopy Indications:              Abnormal CT of the GI tract showing duodenitis,                            Diarrhea Medicines:                Monitored Anesthesia Care Procedure:                Pre-Anesthesia Assessment:                           - Prior to the procedure, a History and Physical                            was performed, and patient medications and                            allergies were reviewed. The patient's tolerance of                            previous anesthesia was also reviewed. The risks                            and benefits of the procedure and the sedation                            options and risks were discussed with the patient.                            All questions were answered, and informed consent                            was obtained. Prior Anticoagulants: The patient has                            taken no previous anticoagulant or antiplatelet                            agents. ASA Grade Assessment: I - A normal, healthy                            patient. After reviewing the risks and benefits,                            the patient was deemed in satisfactory condition to                            undergo the procedure.  After obtaining informed consent, the endoscope was                            passed under direct vision. Throughout the                            procedure, the patient's blood pressure, pulse, and                            oxygen saturations were monitored continuously. The                            Endoscope was introduced through the mouth, and                            advanced to the second part of duodenum. The upper                             GI endoscopy was accomplished without difficulty.                            The patient tolerated the procedure well. Scope In: Scope Out: Findings:                 The examined esophagus was normal.                           The entire examined stomach was normal. Biopsies                            were taken with a cold forceps for histology and                            Helicobacter pylori testing.                           The examined duodenum was normal. Biopsies for                            histology were taken with a cold forceps for                            evaluation of celiac disease. Complications:            No immediate complications. Estimated Blood Loss:     Estimated blood loss was minimal. Impression:               - Normal esophagus.                           - Normal stomach. Biopsied.                           - Normal examined duodenum. Biopsied. Recommendation:           - Patient has a contact number available for  emergencies. The signs and symptoms of potential                            delayed complications were discussed with the                            patient. Return to normal activities tomorrow.                            Written discharge instructions were provided to the                            patient.                           - Resume previous diet.                           - Continue present medications.                           - Await pathology results.                           - See the other procedure note for documentation of                            additional recommendations. Beverley Fiedler, MD 10/21/2021 2:38:06 PM This report has been signed electronically.

## 2021-10-21 NOTE — Progress Notes (Signed)
Report given to PACU, vss 

## 2021-10-21 NOTE — Progress Notes (Signed)
Called to room to assist during endoscopic procedure.  Patient ID and intended procedure confirmed with present staff. Received instructions for my participation in the procedure from the performing physician.  

## 2021-10-21 NOTE — Progress Notes (Signed)
1412 Robinul 0.1 mg IV given due large amount of secretions upon assessment.  MD made aware, vss  

## 2021-10-22 ENCOUNTER — Telehealth: Payer: Self-pay

## 2021-10-22 NOTE — Telephone Encounter (Signed)
  Follow up Call-     10/21/2021    1:05 PM  Call back number  Post procedure Call Back phone  # [phone]  Permission to leave phone message Yes   Follow-up call, LVM.

## 2021-10-26 ENCOUNTER — Other Ambulatory Visit: Payer: Self-pay

## 2021-10-26 ENCOUNTER — Encounter: Payer: Self-pay | Admitting: Internal Medicine

## 2021-10-26 MED ORDER — RIFAXIMIN 550 MG PO TABS: 550.0000 mg | ORAL_TABLET | Freq: Three times a day (TID) | ORAL | 0 refills | Status: DC

## 2021-12-07 ENCOUNTER — Encounter: Payer: Self-pay | Admitting: *Deleted

## 2021-12-22 ENCOUNTER — Ambulatory Visit: Payer: Medicaid Other | Admitting: Gastroenterology

## 2021-12-22 ENCOUNTER — Ambulatory Visit: Payer: Medicaid Other | Admitting: Internal Medicine

## 2022-05-16 DIAGNOSIS — H5213 Myopia, bilateral: Secondary | ICD-10-CM | POA: Diagnosis not present

## 2022-06-16 DIAGNOSIS — R059 Cough, unspecified: Secondary | ICD-10-CM | POA: Diagnosis not present

## 2022-06-16 DIAGNOSIS — Z20822 Contact with and (suspected) exposure to covid-19: Secondary | ICD-10-CM | POA: Diagnosis not present

## 2022-06-16 DIAGNOSIS — J029 Acute pharyngitis, unspecified: Secondary | ICD-10-CM | POA: Diagnosis not present

## 2022-07-07 IMAGING — CT CT ABD-PELV W/ CM
2 of 4 series · 16 of 46 positions shown, 18 images · IV contrast (APPLIED)
Comparison: 02/16/2017

CLINICAL DATA: Diffuse abdominal pain, diarrhea, hematochezia

EXAM:
CT ABDOMEN AND PELVIS WITH CONTRAST
TECHNIQUE: Multidetector CT imaging of the abdomen and pelvis was performed
using the standard protocol following bolus administration of
intravenous contrast.
CONTRAST:  100mL OMNIPAQUE IOHEXOL 300 MG/ML  SOLN

[Series 3: abdomen 5.0 · axial · 0.97mm/px · z∈[+590,+1060]mm · 13 of 104 slices shown, 15 images]
[im 5/104  soft-tissue]
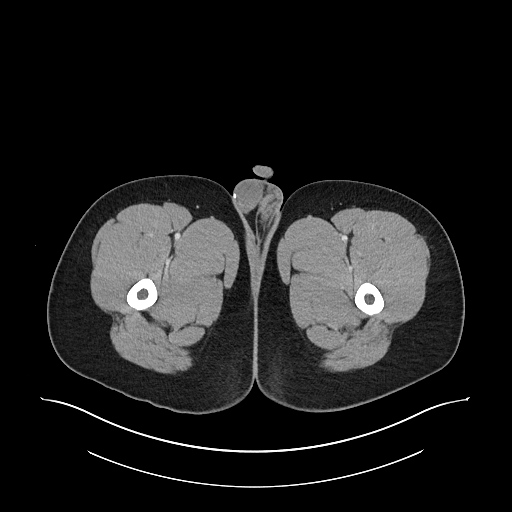
[im 5/104  bone]
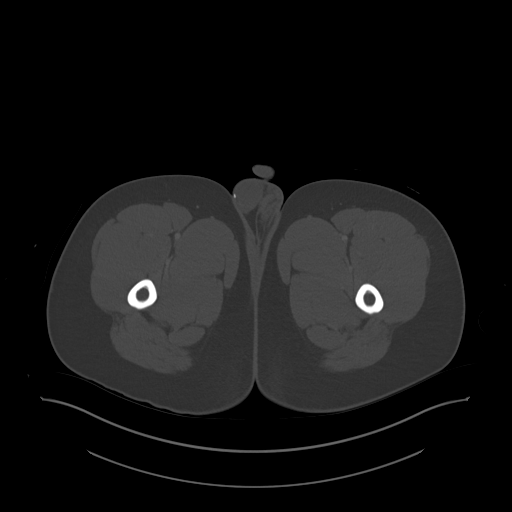
[im 15/104  soft-tissue]
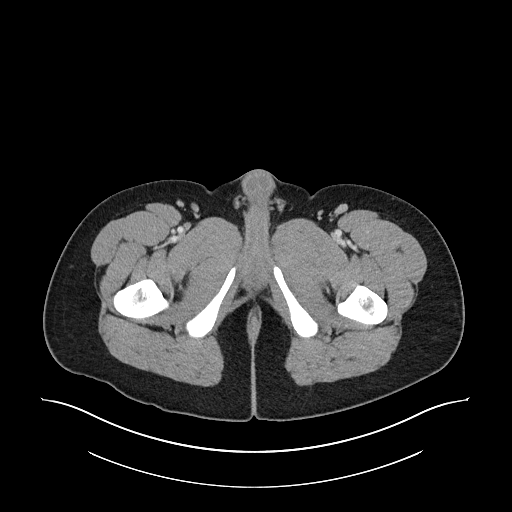
[im 20/104  soft-tissue]
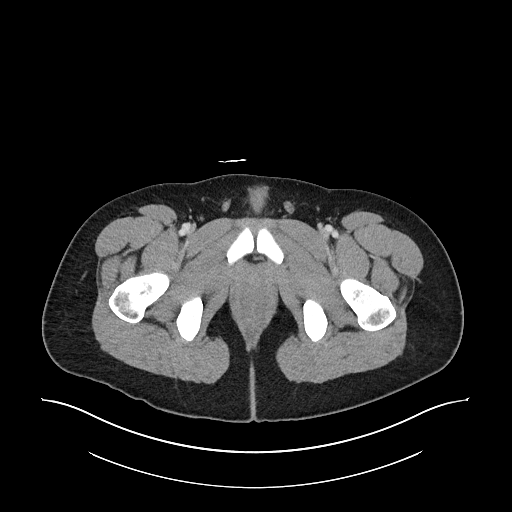
[im 30/104  soft-tissue]
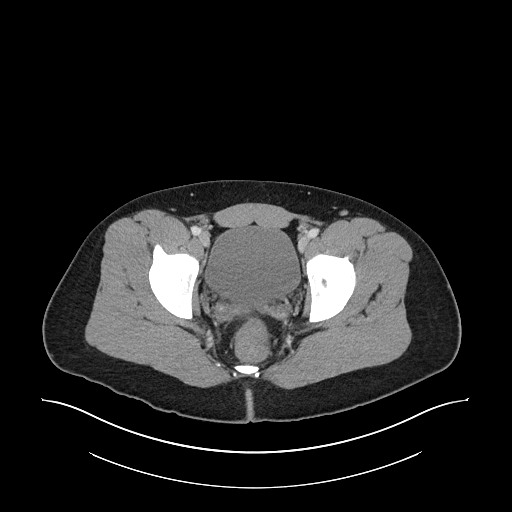
[im 35/104  soft-tissue]
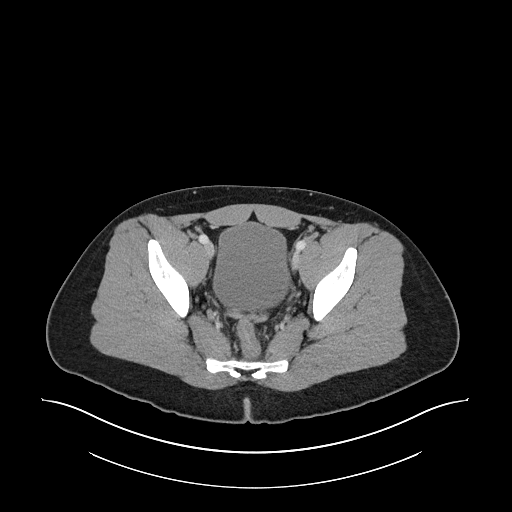
[im 45/104  soft-tissue]
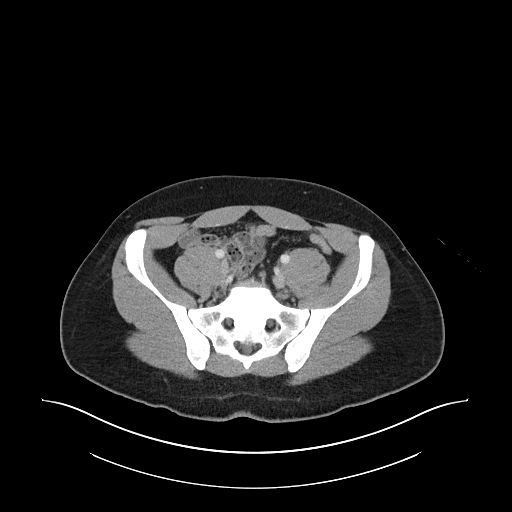
[im 54/104  soft-tissue]
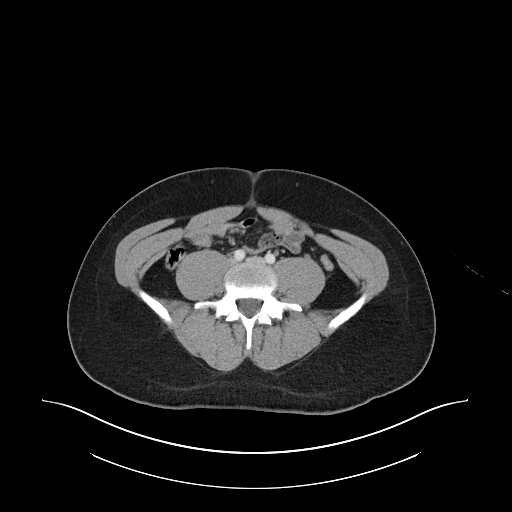
[im 59/104  soft-tissue]
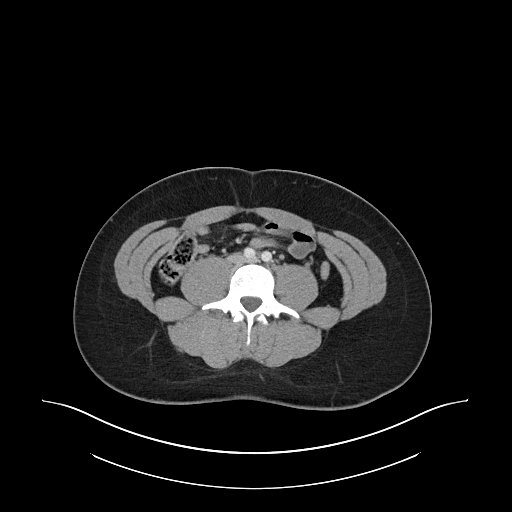
[im 69/104  soft-tissue]
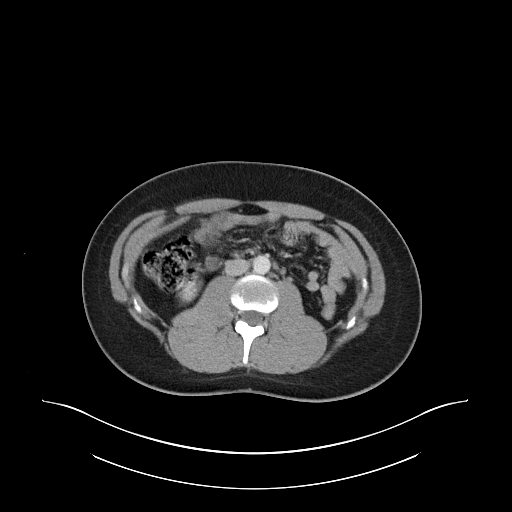
[im 69/104  bone]
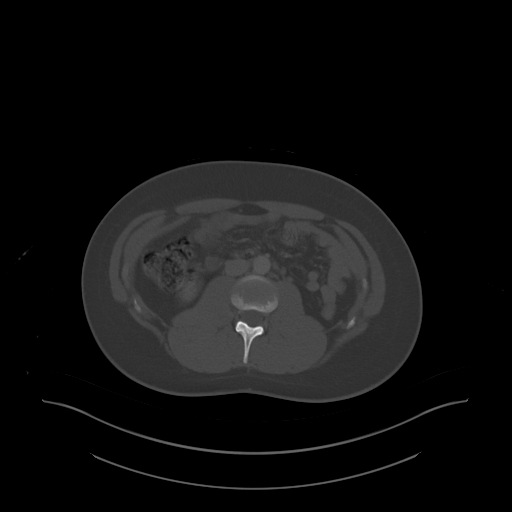
[im 74/104  soft-tissue]
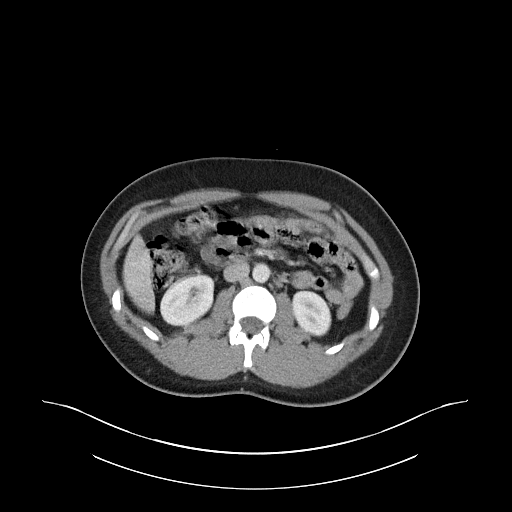
[im 84/104  soft-tissue]
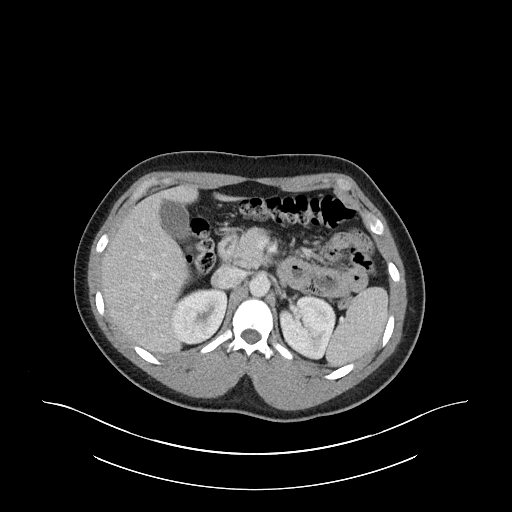
[im 89/104  soft-tissue]
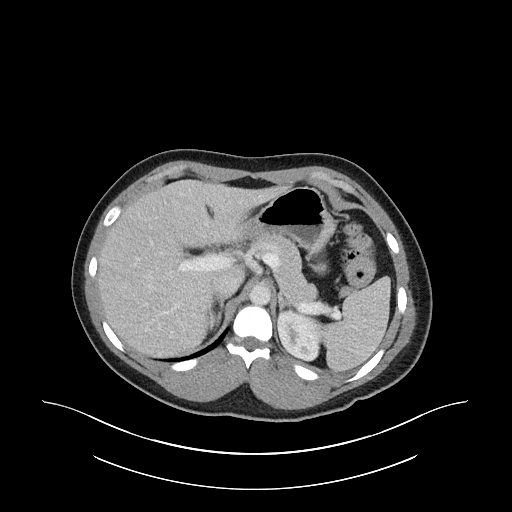
[im 99/104  soft-tissue]
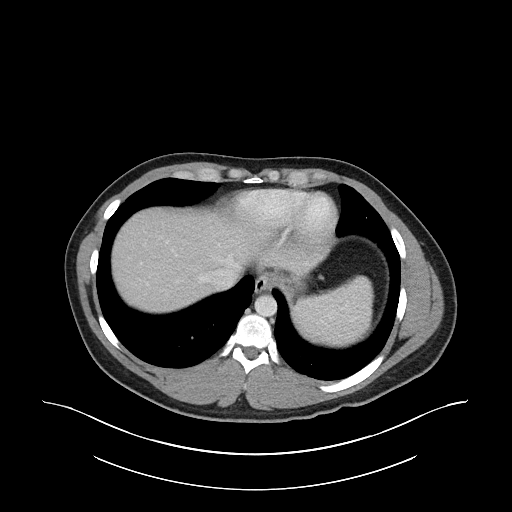

[Series 6: abdomen 3.0 mpr cor · coronal · 0.81mm/px · 3 of 90 slices shown]
[im 30/90  soft-tissue]
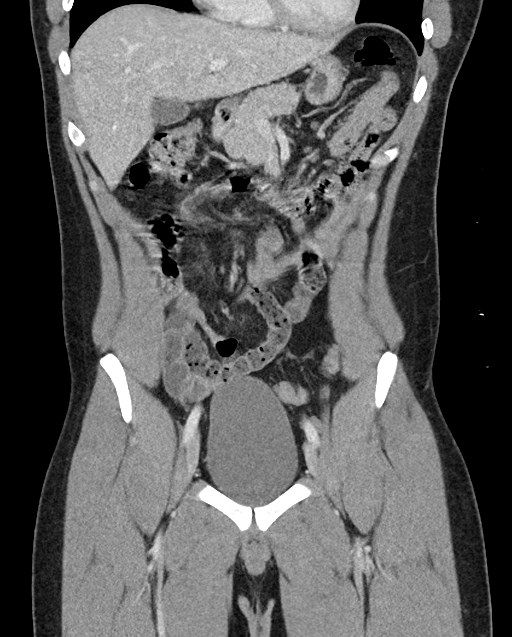
[im 40/90  soft-tissue]
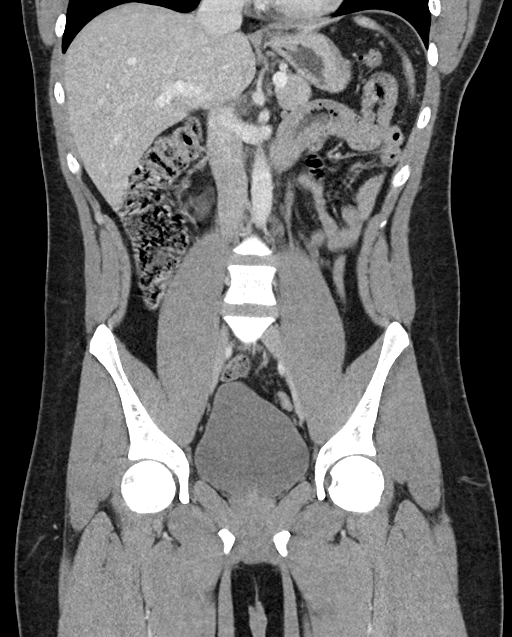
[im 50/90  soft-tissue]
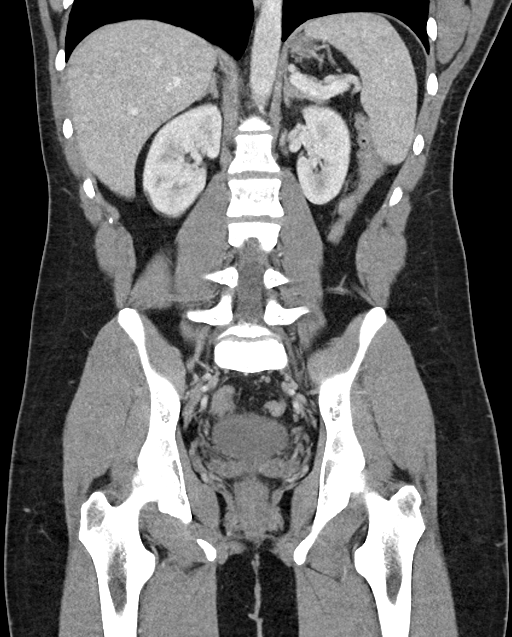

[16 of 46 positions shown; findings below may reference images not displayed]

FINDINGS: Lower chest: The visualized lung bases are clear bilaterally. The
visualized heart and pericardium are unremarkable.

Hepatobiliary: No focal liver abnormality is seen. No gallstones,
gallbladder wall thickening, or biliary dilatation.

Pancreas: Unremarkable

Spleen: Unremarkable

Adrenals/Urinary Tract: Adrenal glands are unremarkable. Kidneys are
normal, without renal calculi, focal lesion, or hydronephrosis.
Bladder is unremarkable.

Stomach/Bowel: There is circumferential thickening and mild
periduodenal inflammatory stranding involving the third and fourth
portion of the duodenum as well as the proximal jejunum in the
region of the ligament of Treitz compatible with changes of
infectious or inflammatory duodenitis. No evidence of obstruction.
No perforation. The stomach, small bowel, and large bowel are
otherwise unremarkable. Appendix normal. No free intraperitoneal gas
or fluid.

Vascular/Lymphatic: No significant vascular findings are present. No
enlarged abdominal or pelvic lymph nodes.

Reproductive: Prostate is unremarkable.

Other: Tiny fat containing umbilical hernia. The rectum is
unremarkable.

Musculoskeletal: The osseous structures are unremarkable.
IMPRESSION: Circumferential thickening and mild periduodenal inflammatory
stranding in keeping with changes of infectious or inflammatory
duodenitis. No evidence of obstruction or perforation.

## 2022-08-01 ENCOUNTER — Other Ambulatory Visit: Payer: Self-pay

## 2022-08-01 ENCOUNTER — Encounter (HOSPITAL_COMMUNITY): Payer: Self-pay

## 2022-08-01 ENCOUNTER — Emergency Department (HOSPITAL_COMMUNITY)
Admission: EM | Admit: 2022-08-01 | Discharge: 2022-08-01 | Disposition: A | Discharge status: Home / Self Care | Payer: Medicaid Other | Attending: Emergency Medicine | Admitting: Emergency Medicine

## 2022-08-01 ENCOUNTER — Emergency Department (HOSPITAL_COMMUNITY): Payer: Medicaid Other

## 2022-08-01 DIAGNOSIS — W311XXA Contact with metalworking machines, initial encounter: Secondary | ICD-10-CM | POA: Insufficient documentation

## 2022-08-01 DIAGNOSIS — Z23 Encounter for immunization: Secondary | ICD-10-CM | POA: Insufficient documentation

## 2022-08-01 DIAGNOSIS — S60931A Unspecified superficial injury of right thumb, initial encounter: Secondary | ICD-10-CM | POA: Diagnosis not present

## 2022-08-01 DIAGNOSIS — Y990 Civilian activity done for income or pay: Secondary | ICD-10-CM | POA: Insufficient documentation

## 2022-08-01 DIAGNOSIS — S6991XA Unspecified injury of right wrist, hand and finger(s), initial encounter: Secondary | ICD-10-CM | POA: Insufficient documentation

## 2022-08-01 DIAGNOSIS — Z0389 Encounter for observation for other suspected diseases and conditions ruled out: Secondary | ICD-10-CM | POA: Diagnosis not present

## 2022-08-01 MED ORDER — TETANUS-DIPHTH-ACELL PERTUSSIS 5-2.5-18.5 LF-MCG/0.5 IM SUSY
0.5000 mL | PREFILLED_SYRINGE | Freq: Once | INTRAMUSCULAR | Status: AC
  Administered 2022-08-01: 0.5 mL via INTRAMUSCULAR
  Filled 2022-08-01: qty 0.5

## 2022-08-01 MED ORDER — BACITRACIN ZINC 500 UNIT/GM EX OINT
TOPICAL_OINTMENT | CUTANEOUS | Status: AC
  Administered 2022-08-01: 1 via TOPICAL
  Filled 2022-08-01: qty 0.9

## 2022-08-01 NOTE — ED Provider Notes (Signed)
[place] EMERGENCY DEPARTMENT AT [facility] Provider Note   CSN: [id number] Arrival date & time: 08/01/22  1711     History Chief Complaint  Patient presents with   Finger Injury    James Phillips is a 22 y.o. male reportedly otherwise healthy presents to the ER for evaluation of right thumb pain. The patient reports that he was putting in a window [facility] unit and was holding the bracket and his dad drilled a screw through his right thumb. He used the drill to remove the screw as well. Tetanus is not up to date. Patient is right hand dominant. Reports throbbing. Allergic to copper.   HPI     Home Medications Prior to Admission medications   Medication Sig Start Date End Date Taking? Authorizing Provider  diclofenac Sodium (VOLTAREN) 1 % GEL Apply 2 g topically 4 (four) times daily. 09/03/21   Alfredo Martinez, MD  dicyclomine (BENTYL) 10 MG capsule Take 1 capsule (10 mg total) by mouth 3 (three) times daily as needed for spasms. 09/03/21   Arnaldo Natal, NP  fluticasone (FLONASE) 50 MCG/ACT nasal spray Place 2 sprays into both nostrils daily. 07/06/21   Jackelyn Poling, DO  rifaximin (XIFAXAN) 550 MG TABS tablet Take 1 tablet (550 mg total) by mouth 3 (three) times daily. 10/26/21   [name], [name], MD      Allergies    Copper-containing compounds    Review of Systems   Review of Systems  Skin:  Positive for wound.  Neurological:  Positive for numbness.    Physical Exam Updated Vital Signs BP 130/84   Pulse 85   Temp 98 F (36.7 C)   Resp 20   Wt 90 kg   SpO2 96%   BMI 29.30 kg/m  Physical Exam Vitals and nursing note reviewed.  Constitutional:      Appearance: Normal appearance.  Eyes:     General: No scleral icterus. Pulmonary:     Effort: Pulmonary effort is normal. No respiratory distress.  Musculoskeletal:        General: Tenderness and signs of injury present.     Comments: Injury seen to the more the distal palmar aspect of the  right thumb.  Entry and exit wounds seen.  Please see images.  No nail involvement.  Patient can fully flex and extend and AB and abduct his thumb.  Does have some trouble with finger thumb opposition with resistance although reports secondary to pain.  Reports numbness to the more palmar aspect of the distal thumb.  Cap refill less than 2 seconds.  Palpable radial pulse.  Compartments soft.  Coloration appear symmetric.  Skin:    General: Skin is dry.     Findings: No rash.  Neurological:     General: No focal deficit present.     Mental Status: He is alert. Mental status is at baseline.  Psychiatric:        Mood and Affect: Mood normal.        ED Results / Procedures / Treatments   Labs (all labs ordered are listed, but only abnormal results are displayed) Labs Reviewed - No data to display  EKG None  Radiology DG Finger Thumb Right  Result Date: 08/01/2022 CLINICAL DATA:  screwed thru thumb, now screw removed EXAM: RIGHT THUMB 2+V COMPARISON:  None Available. FINDINGS: There is no evidence of fracture or dislocation. There is no evidence of arthropathy or other focal bone abnormality. Soft tissues are unremarkable. No radiopaque foreign  body. IMPRESSION: Negative radiographs of the right thumb. Electronically Signed   By: Narda Rutherford M.D.   On: 08/01/2022 18:10    Procedures Procedures   Medications Ordered in ED Medications  Tdap (BOOSTRIX) injection 0.5 mL (0.5 mLs Intramuscular Given 08/01/22 1846)    ED Course/ Medical Decision Making/ A&P                           Medical Decision Making Amount and/or Complexity of Data Reviewed Radiology: ordered.  Risk Prescription drug management.   22 y.o. male presents to the ER today for evaluation of right thumb injury. Differential diagnosis includes but is not limited to nerve damage, ligament damage, tendon damage, broken bone. Vital signs unremarkable. Physical exam as noted above.   X-ray imaging shows Negative  radiographs of the right thumb.  Given his full range of motion of his thumb but some sensation deficit, could have some sensory nerve damage.  I listed for any motor nerve damage given that he has full flexion extension and AB and adduction of the thumb.  Does have some trouble with finger thumb opposition with resistance however reports it is to pain.  Otherwise, he has brisk cap refill, coloration appears normal to the thumb, compartments are soft. Will give him follow up with hand surgery. Nursing to clean, apply bacitracin, and bandage. Discussed with my attending about antibiotics which she does not recommend. He reports that he can not remember when his last tetanus was and knows it wasn't within the past 5 years. Tetanus was updated here. Stable for discharge home with hand surgery follow up.   We discussed plan and imaging at bedside. Wound care information provided. Stressed the importance of wound care and following up with hand surgery. We discussed strict return precautions and red flag symptoms. The patient verbalized their understanding and agrees to the plan. The patient is stable and being discharged home in good condition.  Portions of this report may have been transcribed using voice recognition software. Every effort was made to ensure accuracy; however, inadvertent computerized transcription errors may be present.   Final Clinical Impression(s) / ED Diagnoses Final diagnoses:  Injury of right thumb, initial encounter    Rx / DC Orders ED Discharge Orders     None         Achille Rich, PA-C 08/01/22 1925    Loetta Rough, MD 08/01/22 [id number]

## 2022-08-01 NOTE — ED Triage Notes (Signed)
C/o drilling screw threw thumb at work.  Tetanus not up to date.  Bleeding controlled ROM noted

## 2022-08-01 NOTE — Discharge Instructions (Signed)
You were seen in the ER today for evaluation of your thumb.  The x-ray does not show any signs of a broken bone.  You may have some nerve damage given the numbness to your thumb.  I would like for you to follow-up with a hand surgeon.  I included information for Dr. Carollee Massed office to this discharge paperwork.  Please make sure you call to schedule an appointment.  In the meantime, please make sure you are cleaning the wound at least once a day and then cover for the next 24 to 48 hours.  He can apply a topical antibiotic ointment or Vaseline or Aquaphor to the area.  Please make sure you are not soaking the hand in any dirty water such as bath water or dishwater.  You can take Tylenol at 1000 mg or ibuprofen 600 mg every 6 hours as needed for pain.  If you have any concerns, new or worsening symptoms, please return to the nearest emergency room for evaluation.  Contact a health care provider if: You received a tetanus shot and you have swelling, severe pain, redness, or bleeding at the injection site. Your pain is not controlled with medicine. You have any of these signs of infection: More redness, swelling, or pain around the wound. Fluid or blood coming from the wound. Warmth coming from the wound. A fever or chills. You are nauseous or you vomit. You are dizzy. You have a new rash or hardness around the wound. Get help right away if: You have a red streak of skin near the area around your wound. Pus or a bad smell coming from the wound. Your wound has been closed with staples, sutures, skin glue, or adhesive strips and it begins to open up and separate. Your wound is bleeding, and the bleeding does not stop with gentle pressure. These symptoms may represent a serious problem that is an emergency. Do not wait to see if the symptoms will go away. Get medical help right away. Call your local emergency services (911 in the [place] Do not drive yourself to the hospital.

## 2022-08-19 ENCOUNTER — Ambulatory Visit: Payer: Medicaid Other | Admitting: Student

## 2022-08-26 ENCOUNTER — Encounter: Payer: Self-pay | Admitting: Student

## 2022-08-26 ENCOUNTER — Ambulatory Visit (INDEPENDENT_AMBULATORY_CARE_PROVIDER_SITE_OTHER): Payer: Medicaid Other | Admitting: Student

## 2022-08-26 VITALS — BP 110/60 | HR 88 | Ht 69.0 in | Wt 213.0 lb

## 2022-08-26 DIAGNOSIS — N5089 Other specified disorders of the male genital organs: Secondary | ICD-10-CM | POA: Diagnosis not present

## 2022-08-26 DIAGNOSIS — R5383 Other fatigue: Secondary | ICD-10-CM

## 2022-08-26 LAB — POCT GLYCOSYLATED HEMOGLOBIN (HGB A1C): Hemoglobin A1C: 4.7 % (ref 4.0–5.6)

## 2022-08-26 NOTE — Assessment & Plan Note (Addendum)
Worsening exercise tolerance, weight gain, feeling "tired" daily. Reports normal sleep. Strong family history of endocrinopathy. Differential is broad: Endocrinopathy, obesity, disordered sleep, anemia, mood disorder. -CBC,TSH w/ reflex, A1C

## 2022-08-26 NOTE — Patient Instructions (Addendum)
It was great to see you! Thank you for allowing me to participate in your care!   I recommend that you always bring your medications to each appointment as this makes it easy to ensure we are on the correct medications and helps [place] not miss when refills are needed.  Our plans for today:  - Please go to your ultrasound appointment by 2 pm at [facility]. Go to entrance A, check in at front desk, and they will tell you how to get to the ultrasound clinic. - Follow-up in 1 week for back and leg pain  We are checking some labs today, I will call you if they are abnormal will send you a MyChart message or a letter if they are normal.  If you do not hear about your labs in the next 2 weeks please let us know.  Take care and seek immediate care sooner if you develop any concerns. Please remember to show up 15 minutes before your scheduled appointment time!  Tiffany Kocher, DO [facility]

## 2022-08-26 NOTE — Assessment & Plan Note (Signed)
Left sided, hard, non-tender antero-superior mass on left testicle. Intermittent discomfort. Denies growth. No GU symptoms. Differential includes: Germcell/Sexcord tumor, epididymal cyst. -U/S scrotum

## 2022-08-26 NOTE — Progress Notes (Signed)
    SUBJECTIVE:   CHIEF COMPLAINT / HPI:   Fatigue Patient has concern for diabetes, mother/dad/paternal grandma with diabetes. No polydipsia, polyphagia. No darkening of skin. No polyuria. BMI 31.45. Tired after meals, no particular meals. Increased exercise intolerance. Reports adequate sleep (7 hours). Reports staying hydrated. Had cousin with stroke. Hypothyroidism in sister.   Testicular mass Noted a testicular mass on left side, for years. Consistent size, soft. Intermittently painful. No family hx of testicular cancer. No hx of undescended testicle. No dysuria, no dyspareunia. No discoloration. No prior GU surgery or issues. No discharge. Uses condoms.   OBJECTIVE:   BP 110/60   Pulse 88   Ht 5\' 9"  (1.753 m)   Wt 213 lb (96.6 kg)   SpO2 99%   BMI 31.45 kg/m    General: NAD, pleasant HEENT: Normocephalic, atraumatic head. Normal external ear, EOM intact and normal conjunctiva BL. Normal external nose.Normal dentition.  Cardio: RRR, no MRG. Respiratory: CTAB, normal wob on RA Skin: Warm and dry  **Chaperone Deseree MA present during exam** GU exam: Normal right testicle. Hard, non-tender, antero-superior mass on left testicle. Scrotum is non-erythematous, no external lesions. Normal uncircumcised penis, no lesions.  ASSESSMENT/PLAN:   Testicular mass Left sided, hard, non-tender antero-superior mass on left testicle. Intermittent discomfort. Denies growth. No GU symptoms. Differential includes: Germcell/Sexcord tumor, epididymal cyst. -U/S scrotum  Other fatigue Worsening exercise tolerance, weight gain, feeling "tired" daily. Reports normal sleep. Strong family history of endocrinopathy. Differential is broad: Endocrinopathy, obesity, disordered sleep, anemia, mood disorder. -CBC,TSH w/ reflex, A1C    Follow up recommendations Appointment scheduled next week to discuss low back pain, right sided leg pain, and varicose veins. Inquire about substance use  Tiffany Kocher, DO [facility]

## 2022-08-27 LAB — TSH RFX ON ABNORMAL TO FREE T4: TSH: 2.18 u[IU]/mL (ref 0.450–4.500)

## 2022-08-27 LAB — CBC
Hematocrit: 44.3 % (ref 37.5–51.0)
Hemoglobin: 15.4 g/dL (ref 13.0–17.7)
MCH: 30.1 pg (ref 26.6–33.0)
MCHC: 34.8 g/dL (ref 31.5–35.7)
MCV: 87 fL (ref 79–97)
Platelets: 159 10*3/uL (ref 150–450)
RBC: 5.11 x10E6/uL (ref 4.14–5.80)
RDW: 11.7 % (ref 11.6–15.4)
WBC: 6 10*3/uL (ref 3.4–10.8)

## 2022-08-30 ENCOUNTER — Ambulatory Visit (HOSPITAL_COMMUNITY): Payer: Medicaid Other

## 2022-09-01 ENCOUNTER — Ambulatory Visit (HOSPITAL_COMMUNITY)
Admission: RE | Admit: 2022-09-01 | Discharge: 2022-09-01 | Disposition: A | Discharge status: Home / Self Care | Payer: Medicaid Other | Source: Ambulatory Visit | Attending: Family Medicine | Admitting: Family Medicine

## 2022-09-01 DIAGNOSIS — N5089 Other specified disorders of the male genital organs: Secondary | ICD-10-CM | POA: Insufficient documentation

## 2022-09-01 DIAGNOSIS — N503 Cyst of epididymis: Secondary | ICD-10-CM | POA: Diagnosis not present

## 2022-09-02 ENCOUNTER — Encounter: Payer: Self-pay | Admitting: Student

## 2022-09-02 ENCOUNTER — Ambulatory Visit: Payer: Medicaid Other | Admitting: Student

## 2022-09-02 VITALS — BP 113/66 | HR 97 | Ht 69.0 in | Wt 212.5 lb

## 2022-09-02 DIAGNOSIS — I83891 Varicose veins of right lower extremities with other complications: Secondary | ICD-10-CM | POA: Diagnosis not present

## 2022-09-02 DIAGNOSIS — M545 Low back pain, unspecified: Secondary | ICD-10-CM | POA: Diagnosis not present

## 2022-09-02 DIAGNOSIS — G8929 Other chronic pain: Secondary | ICD-10-CM

## 2022-09-02 DIAGNOSIS — I861 Scrotal varices: Secondary | ICD-10-CM

## 2022-09-02 DIAGNOSIS — N5089 Other specified disorders of the male genital organs: Secondary | ICD-10-CM | POA: Diagnosis not present

## 2022-09-02 DIAGNOSIS — R0683 Snoring: Secondary | ICD-10-CM | POA: Diagnosis not present

## 2022-09-02 NOTE — Assessment & Plan Note (Signed)
Left-sided varicocele and left epididymal head cyst. Patient politely declines urology referral. -CTM
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
## 2022-09-02 NOTE — Patient Instructions (Addendum)
It was great to see you! Thank you for allowing me to participate in your care!   I recommend that you always bring your medications to each appointment as this makes it easy to ensure we are on the correct medications and helps [place] not miss when refills are needed.  Our plans for today:  - Take 1000 mg of tylenol in the morning and evening. Take 600 mg of iburofen every 6 hours as needed for back pain. - Ice, heat and rest your back as necessary - Remember to stay active! - A referral to sleep studies has been sent. You will receive a call in 3-4 weeks. I encourage you to inquire about costs. - A referral to physical therapy has been sent. You will receive a call in 3-4 weeks. - You have an ultrasound of your legs to evaluate the veins on July 9 @ 10 am.  Take care and seek immediate care sooner if you develop any concerns. Please remember to show up 15 minutes before your scheduled appointment time!  Tiffany Kocher, DO [facility]

## 2022-09-02 NOTE — Assessment & Plan Note (Signed)
Varicose veins on right leg, lower suspicion of thrombophlebitis with minimal pain and chronicity. Intermittent calf swelling with pain over several months, c/f DVT.  -BL LE DVT doppler scheduled -Follow-up results

## 2022-09-02 NOTE — Assessment & Plan Note (Addendum)
Chronic. No red flag sxs. Shared decision making for conservative management and life-style modification around weight loss. - Tylenol and ibuprofen - Referral to PT - f/u 4-6 weeks

## 2022-09-14 ENCOUNTER — Ambulatory Visit (HOSPITAL_COMMUNITY)
Admission: RE | Admit: 2022-09-14 | Discharge: 2022-09-14 | Disposition: A | Discharge status: Home / Self Care | Payer: Medicaid Other | Source: Ambulatory Visit | Attending: Family Medicine | Admitting: Family Medicine

## 2022-09-14 DIAGNOSIS — I83891 Varicose veins of right lower extremities with other complications: Secondary | ICD-10-CM | POA: Diagnosis not present

## 2022-09-15 ENCOUNTER — Telehealth: Payer: Self-pay | Admitting: Student

## 2022-09-15 NOTE — Telephone Encounter (Signed)
Called patient. Confirmed identity. Discussed LE U/S results. NO DVT nor Cyst. Suspect the non-symptomatic varicosity on leg is superficial. Conservative treatment discussed. Patient expressed understanding and agreement with plan.

## 2022-10-12 ENCOUNTER — Ambulatory Visit: Payer: Self-pay | Admitting: Student

## 2022-10-14 ENCOUNTER — Ambulatory Visit: Payer: Medicaid Other

## 2022-10-18 ENCOUNTER — Encounter: Payer: Self-pay | Admitting: Student

## 2022-10-18 ENCOUNTER — Ambulatory Visit (INDEPENDENT_AMBULATORY_CARE_PROVIDER_SITE_OTHER): Payer: Medicaid Other | Admitting: Student

## 2022-10-18 VITALS — BP 116/80 | HR 79 | Temp 98.1°F | Ht 69.0 in | Wt 213.2 lb

## 2022-10-18 DIAGNOSIS — J029 Acute pharyngitis, unspecified: Secondary | ICD-10-CM | POA: Diagnosis present

## 2022-10-18 LAB — POCT RAPID STREP A (OFFICE): Rapid Strep A Screen: NEGATIVE

## 2022-10-18 NOTE — Patient Instructions (Addendum)
It was great to see you today! Thank you for choosing [facility] for your primary care.  Fluids: make sure you drink enough Gatorade and water.  Eating or drinking warm liquids such as tea or chicken soup may help with nasal congestion   - Chamomile tea has antiviral properties.     - research studies show that honey works better than cough medicine  Take 650-100 mg of Tylenol every 6 hours as needed for pain relief or fever.  Avoid taking more than 3000 mg in a 24-hour period (This may cause liver damage).  If you haven't already, sign up for My Chart to have easy access to your labs results, and communication with your primary care physician. We are checking some labs today. If they are abnormal, I will call you. If they are normal, I will send you a MyChart message (if it is active) or a letter in the mail. If you do not hear about your labs in the next 2 weeks, please call the office. I recommend that you always bring your medications to each appointment as this makes it easy to ensure you are on the correct medications and helps [place] not miss refills when you need them. Call the clinic at [phone] if your symptoms worsen or you have any concerns. Return in about 1 week (around 10/25/2022) for Congestion and sore throat . Please arrive 15 minutes before your appointment to ensure smooth check in process.  We appreciate your efforts in making this happen.  Thank you for allowing me to participate in your care, Alfredo Martinez, MD 10/18/2022, 2:32 PM PGY-3, [facility]
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
## 2022-10-18 NOTE — Assessment & Plan Note (Signed)
Likely viral with normal p.o. intake, no red flag symptoms.  Strep a rapid in office negative today.  Continue with warm liquids, gargling warm salt water, over-the-counter analgesics, Chloraseptic spray.  Return if symptoms worsen or change in any way or if they remained the same at the end of the week.

## 2022-10-20 ENCOUNTER — Ambulatory Visit: Payer: Medicaid Other | Admitting: Family Medicine

## 2022-10-21 ENCOUNTER — Ambulatory Visit: Payer: Medicaid Other

## 2022-11-03 ENCOUNTER — Ambulatory Visit: Payer: Medicaid Other | Attending: Family Medicine

## 2022-11-15 ENCOUNTER — Encounter: Payer: Self-pay | Admitting: Neurology

## 2022-11-15 ENCOUNTER — Ambulatory Visit (INDEPENDENT_AMBULATORY_CARE_PROVIDER_SITE_OTHER): Payer: Medicaid Other | Admitting: Neurology

## 2022-11-15 VITALS — BP 119/72 | HR 75 | Ht 69.0 in | Wt 213.2 lb

## 2022-11-15 DIAGNOSIS — Z9189 Other specified personal risk factors, not elsewhere classified: Secondary | ICD-10-CM | POA: Diagnosis not present

## 2022-11-15 DIAGNOSIS — E669 Obesity, unspecified: Secondary | ICD-10-CM

## 2022-11-15 DIAGNOSIS — E66811 Obesity, class 1: Secondary | ICD-10-CM

## 2022-11-15 DIAGNOSIS — R0683 Snoring: Secondary | ICD-10-CM | POA: Diagnosis not present

## 2022-11-15 DIAGNOSIS — Z820 Family history of epilepsy and other diseases of the nervous system: Secondary | ICD-10-CM

## 2022-11-15 DIAGNOSIS — R519 Headache, unspecified: Secondary | ICD-10-CM

## 2022-11-15 DIAGNOSIS — G4719 Other hypersomnia: Secondary | ICD-10-CM

## 2022-11-15 DIAGNOSIS — R635 Abnormal weight gain: Secondary | ICD-10-CM

## 2022-11-15 NOTE — Patient Instructions (Signed)

## 2022-11-15 NOTE — Progress Notes (Signed)
Subjective:    Patient ID: James Phillips is a 22 y.o. male.  HPI    Huston Foley, MD, PhD [facility] [address] [address] [place], [place] [address]  Dear Dr. Claudean Severance,  I saw your patient, James Phillips upon your kind request in my sleep clinic today for evaluation of his sleep disorder, in particular, concern for underlying obstructive sleep apnea.  The patient is unaccompanied today.  As you know, James Phillips is a 22 year old male with an underlying medical history of low back pain, varicocele, and mild obesity, who reports snoring and a family history of sleep apnea affecting his father who has a CPAP machine.   His Epworth sleepiness score is 5 out of 24, fatigue severity score is 21 out of 63.  He reports that his dad has sleep apnea.  He has gained weight in the realm of 30 pounds within the past year but is currently working on weight loss and it is coming down.  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  He has his apartment in [place] but goes back and [employer] as well to visit his fiance.  Bedtime is generally around midnight but it may be around 1 or 1:30 AM before he falls asleep.  Rise time is around 8.  He has no nightly nocturia but has had occasional morning headaches, not like a migraine, he has had infrequent migraines before.  I reviewed your office note from 09/02/2022.  He does not drink daily caffeine, he drinks an energy drink occasionally especially if he has to drive a longer distance.  He does not smoke cigarettes, he smokes marijuana occasionally but not lately.  He has alcohol occasionally, mainly on the weekends.  He has no pets in his apartment.  His Past Medical History Is Significant For: Past Medical  History:  Diagnosis Date   Allergy    Anal fissure    Anxiety     His Past Surgical History Is Significant For: Past Surgical History:  Procedure Laterality Date   WISDOM TOOTH EXTRACTION      His Family History Is Significant For: Family History  Problem Relation Age of Onset   Diabetes Father    Sleep apnea Father    Asthma Brother    Colon cancer Neg Hx    Stomach cancer Neg Hx    Esophageal cancer Neg Hx    Pancreatic cancer Neg Hx    Rectal cancer Neg Hx     His Social History Is Significant For: Social History   Socioeconomic History   Marital status: Single    Spouse name: Not on file   Number of children: Not on file   Years of education: Not on file   Highest education level: Not on file  Occupational History   Not on file  Tobacco Use   Smoking status: Never   Smokeless tobacco: Never  Vaping Use   Vaping status: Never Used  Substance and Sexual Activity   Alcohol use: Yes    Comment: occ   Drug use: Yes    Types: Marijuana    Comment: last use 10/14/21 - one joint   Sexual activity: Not on file  Other Topics Concern   Not on file  Social History  Narrative   Not on file   Social Determinants of Health   Financial Resource Strain: Not on file  Food Insecurity: Not on file  Transportation Needs: Not on file  Physical Activity: Not on file  Stress: Not on file  Social Connections: Not on file    His Allergies Are:  Allergies  Allergen Reactions   Copper-Containing Compounds   :   His Current Medications Are:  Outpatient Encounter Medications as of 11/15/2022  Medication Sig   diclofenac Sodium (VOLTAREN) 1 % GEL Apply 2 g topically 4 (four) times daily.   dicyclomine (BENTYL) 10 MG capsule Take 1 capsule (10 mg total) by mouth 3 (three) times daily as needed for spasms.   fluticasone (FLONASE) 50 MCG/ACT nasal spray Place 2 sprays into both nostrils daily.   No facility-administered encounter medications on file as of 11/15/2022.   :   Review of Systems:  Out of a complete 14 point review of systems, all are reviewed and negative with the exception of these symptoms as listed below:   Review of Systems  Neurological:        Pt here for sleep consult Pt snores,fatigue,headaches Pt denies sleep study,CPAP machine,hypertension    ESS:5 FSS: 21    Objective:  Neurological Exam  Physical Exam Physical Examination:   Vitals:   11/15/22 1106  BP: 119/72  Pulse: 75    General Examination: The patient is a very pleasant 22 y.o. male in no acute distress. He appears well-developed and well-nourished and well groomed.   HEENT: Normocephalic, atraumatic, pupils are equal, round and reactive to light, extraocular tracking is good without limitation to gaze excursion or nystagmus noted. Hearing is grossly intact. Face is symmetric with normal facial animation. Speech is clear with no dysarthria noted. There is no hypophonia. There is no lip, neck/head, jaw or voice tremor. Neck is supple with full range of passive and active motion. There are no carotid bruits on auscultation. Oropharynx exam reveals: mild mouth dryness, good dental hygiene and mild airway crowding, due to small airway entry, tonsillar size of 1-2+, right side a little bigger than left.  Mallampati class I.  Neck circumference 16-1/4 inches.  No significant overbite noted.  Tongue protrudes centrally and palate elevates symmetrically.   Chest: Clear to auscultation without wheezing, rhonchi or crackles noted.  Heart: S1+S2+0, regular and normal without murmurs, rubs or gallops noted.   Abdomen: Soft, non-tender and non-distended.  Extremities: There is no pitting edema in the distal lower extremities bilaterally.   Skin: Warm and dry without trophic changes noted.   Musculoskeletal: exam reveals no obvious joint deformities.   Neurologically:  Mental status: The patient is awake, alert and oriented in all 4 spheres. His immediate and remote  memory, attention, language skills and fund of knowledge are appropriate. There is no evidence of aphasia, agnosia, apraxia or anomia. Speech is clear with normal prosody and enunciation. Thought process is linear. Mood is normal and affect is normal.  Cranial nerves II - XII are as described above under HEENT exam.  Motor exam: Normal bulk, strength and tone is noted. There is no obvious action or resting tremor.  Fine motor skills and coordination: grossly intact.  Cerebellar testing: No dysmetria or intention tremor. There is no truncal or gait ataxia.  Sensory exam: intact to light touch in the upper and lower extremities.  Gait, station and balance: He stands easily. No veering to one side is noted. No leaning to one side is  noted. Posture is age-appropriate and stance is narrow based. Gait shows normal stride length and normal pace. No problems turning are noted.   Assessment and Plan:  In summary, James Phillips is a very pleasant 22 y.o.-year old male with an underlying medical history of low back pain, varicocele, and mild obesity, whose history and physical exam are concerning for sleep disordered breathing, particularly obstructive sleep apnea (OSA). A laboratory attended sleep study is typically considered "gold standard" for evaluation of sleep disordered breathing.   I had a long chat with the patient about my findings and the diagnosis of sleep apnea, particularly OSA, its prognosis and treatment options. We talked about medical/conservative treatments, surgical interventions and non-pharmacological approaches for symptom control. I explained, in particular, the risks and ramifications of untreated moderate to severe OSA, especially with respect to developing cardiovascular disease down the road, including congestive heart failure (CHF), difficult to treat hypertension, cardiac arrhythmias (particularly A-fib), neurovascular complications including TIA, stroke and dementia. Even  type 2 diabetes has, in part, been linked to untreated OSA. Symptoms of untreated OSA may include (but may not be limited to) daytime sleepiness, nocturia (i.e. frequent nighttime urination), memory problems, mood irritability and suboptimally controlled or worsening mood disorder such as depression and/or anxiety, lack of energy, lack of motivation, physical discomfort, as well as recurrent headaches, especially morning or nocturnal headaches. We talked about the importance of maintaining a healthy lifestyle and striving for healthy weight.  The importance of complete smoking cessation was also addressed (even non-nicotine).  In addition, we talked about the importance of striving for and maintaining good sleep hygiene. I recommended a sleep study at this time. I outlined the differences between a laboratory attended sleep study which is considered more comprehensive and accurate over the option of a home sleep test (HST); the latter may lead to underestimation of sleep disordered breathing in some instances and does not help with diagnosing upper airway resistance syndrome and is not accurate enough to diagnose primary central sleep apnea typically. I outlined possible surgical and non-surgical treatment options of OSA, including the use of a positive airway pressure (PAP) device (i.e. CPAP, AutoPAP/APAP or BiPAP in certain circumstances), a custom-made dental device (aka oral appliance, which would require a referral to a specialist dentist or orthodontist typically, and is generally speaking not considered for patients with full dentures or edentulous state), upper airway surgical options, such as traditional UPPP (which is not considered a first-line treatment) or the Inspire device (hypoglossal nerve stimulator, which would involve a referral for consultation with an ENT surgeon, after careful selection, following inclusion criteria - also not first-line treatment). I explained the PAP treatment option to  the patient in detail, as this is generally considered first-line treatment.  The patient indicated that he would be willing to try PAP therapy, if the need arises. I explained the importance of being compliant with PAP treatment, not only for insurance purposes but primarily to improve patient's symptoms symptoms, and for the patient's long term health benefit, including to reduce His cardiovascular risks longer-term.    We will pick up our discussion about the next steps and treatment options after testing.  We will keep him posted as to the test results by phone call and/or MyChart messaging where possible.  We will plan to follow-up in sleep clinic accordingly as well.  I answered all his questions today and the patient was in agreement.   I encouraged him to call with any interim questions, concerns, problems or  updates or email [place] through MyChart.  Generally speaking, sleep test authorizations may take up to 2 weeks, sometimes less, sometimes longer, the patient is encouraged to get in touch with [place] if they do not hear back from the sleep lab staff directly within the next 2 weeks.  Thank you very much for allowing me to participate in the care of this nice patient. If I can be of any further assistance to you please do not hesitate to call me at [phone].  Sincerely,   Huston Foley, MD, PhD

## 2022-11-18 ENCOUNTER — Telehealth: Payer: Self-pay | Admitting: Neurology

## 2022-11-18 NOTE — Telephone Encounter (Signed)
NPSG- [facility] pending uploaded notes.  HST- [facility] no auth req

## 2022-11-23 NOTE — Telephone Encounter (Signed)
NPSG- [facility] Berkley Harvey: [id number] (exp. 11/18/22 to 12/06/22)

## 2022-12-07 DIAGNOSIS — Z20822 Contact with and (suspected) exposure to covid-19: Secondary | ICD-10-CM | POA: Diagnosis not present

## 2022-12-07 DIAGNOSIS — R059 Cough, unspecified: Secondary | ICD-10-CM | POA: Diagnosis not present

## 2022-12-07 DIAGNOSIS — J069 Acute upper respiratory infection, unspecified: Secondary | ICD-10-CM | POA: Diagnosis not present

## 2022-12-11 ENCOUNTER — Ambulatory Visit
Admission: EM | Admit: 2022-12-11 | Discharge: 2022-12-11 | Disposition: A | Discharge status: Home / Self Care | Payer: Medicaid Other | Attending: Internal Medicine | Admitting: Internal Medicine

## 2022-12-11 DIAGNOSIS — J029 Acute pharyngitis, unspecified: Secondary | ICD-10-CM | POA: Diagnosis not present

## 2022-12-11 DIAGNOSIS — B349 Viral infection, unspecified: Secondary | ICD-10-CM | POA: Diagnosis not present

## 2022-12-11 LAB — POCT RAPID STREP A (OFFICE): Rapid Strep A Screen: NEGATIVE

## 2022-12-11 MED ORDER — PROMETHAZINE-DM 6.25-15 MG/5ML PO SYRP
5.0000 mL | ORAL_SOLUTION | Freq: Four times a day (QID) | ORAL | 0 refills | Status: DC | PRN
Start: 2022-12-11 — End: 2023-09-15

## 2022-12-11 NOTE — Discharge Instructions (Signed)
Start Promethazine DM as needed for cough.  Please have this medication will make you drowsy.  Do not drink alcohol drive on this medication.  Lots of rest and fluids.  Please follow-up with your PCP if your symptoms do not improve.  Please go to the ER for any worsening symptoms.  I hope you feel better soon!

## 2022-12-11 NOTE — ED Provider Notes (Signed)
[facility]    CSN: [id number] Arrival date & time: 12/11/22  1056      History   Chief Complaint Chief Complaint  Patient presents with   Nasal Congestion    HPI James Phillips is a 22 y.o. male  presents for evaluation of URI symptoms for 6 days. Patient reports associated symptoms of sore throat, cough, congestion, ear pain, chills. Denies N/V/D, documented fevers, body aches, shortness of breath. Patient does not have a hx of asthma. Patient does not have a history of smoking.  Reports  sick contacts via his band mates at school.  States he was seen on day 1 of symptoms near his school in [place] and had a negative flu and COVID test.  Pt has taken Mucinex OTC for symptoms. Pt has no other concerns at this time.   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 Authorizing Provider  promethazine-dextromethorphan (PROMETHAZINE-DM) 6.25-15 MG/5ML syrup Take 5 mLs by mouth 4 (four) times daily as needed for cough. 12/11/22  Yes Radford Pax, NP  diclofenac Sodium (VOLTAREN) 1 % GEL Apply 2 g topically 4 (four) times daily. 09/03/21   Alfredo Martinez, MD  dicyclomine (BENTYL) 10 MG capsule Take 1 capsule (10 mg total) by mouth 3 (three) times daily as needed for spasms. 09/03/21   Arnaldo Natal, NP  fluticasone (FLONASE) 50 MCG/ACT nasal spray Place 2 sprays into both nostrils daily. 07/06/21   Jackelyn Poling, DO    Family  History Family History  Problem Relation Age of Onset   Diabetes Father    Sleep apnea Father    Asthma Brother    Colon cancer Neg Hx    Stomach cancer Neg Hx    Esophageal cancer Neg Hx    Pancreatic cancer Neg Hx    Rectal cancer Neg Hx     Social History Social History   Tobacco Use   Smoking status: Never   Smokeless tobacco: Never  Vaping Use   Vaping status: Never Used  Substance Use Topics   Alcohol use: Yes    Comment: occ   Drug use: Yes    Types: Marijuana    Comment: last use 10/14/21 - one joint     Allergies   Copper-containing compounds   Review of Systems Review of Systems  Constitutional:  Positive for chills.  HENT:  Positive for congestion, ear pain and sore throat.   Respiratory:  Positive for cough.      Physical Exam Triage Vital Signs ED Triage Vitals [12/11/22 1112]  Encounter Vitals Group     BP (!) 141/82     Systolic BP Percentile      Diastolic BP Percentile      Pulse Rate 72     Resp 17     Temp 98.4 F (36.9 C)  Temp Source Oral     SpO2 95 %     Weight      Height      Head Circumference      Peak Flow      Pain Score 3     Pain Loc      Pain Education      Exclude from Growth Chart    No data found.  Updated Vital Signs BP (!) 141/82 (BP Location: Right Arm)   Pulse 72   Temp 98.4 F (36.9 C) (Oral)   Resp 17   SpO2 95%   Visual Acuity Right Eye Distance:   Left Eye Distance:   Bilateral Distance:    Right Eye Near:   Left Eye Near:    Bilateral Near:     Physical Exam Vitals and nursing note reviewed.  Constitutional:      General: He is not in acute distress.    Appearance: Normal appearance. He is not ill-appearing or toxic-appearing.  HENT:     Head: Normocephalic and atraumatic.     Right Ear: Tympanic membrane and ear canal normal.     Left Ear: Tympanic membrane and ear canal normal.     Nose: Congestion present.     Mouth/Throat:     Mouth: Mucous membranes are moist.     Pharynx:  Posterior oropharyngeal erythema present.  Eyes:     Pupils: Pupils are equal, round, and reactive to light.  Cardiovascular:     Rate and Rhythm: Normal rate and regular rhythm.     Heart sounds: Normal heart sounds.  Pulmonary:     Effort: Pulmonary effort is normal.     Breath sounds: Normal breath sounds.  Musculoskeletal:     Cervical back: Normal range of motion and neck supple.  Lymphadenopathy:     Cervical: No cervical adenopathy.  Skin:    General: Skin is warm and dry.  Neurological:     General: No focal deficit present.     Mental Status: He is alert and oriented to person, place, and time.  Psychiatric:        Mood and Affect: Mood normal.        Behavior: Behavior normal.      UC Treatments / Results  Labs (all labs ordered are listed, but only abnormal results are displayed) Labs Reviewed  POCT RAPID STREP A (OFFICE)    EKG   Radiology No results found.  Procedures Procedures (including critical care time)  Medications Ordered in UC Medications - No data to display  Initial Impression / Assessment and Plan / UC Course  I have reviewed the triage vital signs and the nursing notes.  Pertinent labs & imaging results that were available during my care of the patient were reviewed by me and considered in my medical decision making (see chart for details).      Reviewed exam and sx with patient. No red flags. Neg rapid strep.  Patient had negative flu and COVID testing earlier this week.  Discussed viral illness and symptomatic treatment.  Promethazine DM as needed for cough.  Side effect profile reviewed.  PCP follow-up if symptoms do not improve.  Precautions reviewed. Final Clinical Impressions(s) / UC Diagnoses   Final diagnoses:  Sore throat  Viral illness     Discharge Instructions      Start Promethazine DM as needed for cough.  Please have this medication will make you drowsy.  Do not drink alcohol drive on this  medication.  Lots of  rest and fluids.  Please follow-up with your PCP if your symptoms do not improve.  Please go to the ER for any worsening symptoms.  I hope you feel better soon!     ED Prescriptions     Medication Sig Dispense Auth. Provider   promethazine-dextromethorphan (PROMETHAZINE-DM) 6.25-15 MG/5ML syrup Take 5 mLs by mouth 4 (four) times daily as needed for cough. 118 mL Radford Pax, NP      PDMP not reviewed this encounter.   Radford Pax, NP 12/11/22 1145

## 2022-12-11 NOTE — ED Triage Notes (Signed)
Pt presents with c/o sore throat X 6 days. Pt states he also has nasal congestion.   Home interventions: OTC medicine

## 2022-12-17 ENCOUNTER — Ambulatory Visit: Payer: Medicaid Other | Admitting: Student

## 2022-12-22 ENCOUNTER — Ambulatory Visit
Admission: RE | Admit: 2022-12-22 | Discharge: 2022-12-22 | Disposition: A | Discharge status: Home / Self Care | Payer: Medicaid Other | Source: Ambulatory Visit | Attending: Internal Medicine | Admitting: Internal Medicine

## 2022-12-22 VITALS — BP 144/82 | HR 75 | Temp 99.0°F | Resp 16

## 2022-12-22 DIAGNOSIS — J40 Bronchitis, not specified as acute or chronic: Secondary | ICD-10-CM | POA: Diagnosis not present

## 2022-12-22 DIAGNOSIS — J329 Chronic sinusitis, unspecified: Secondary | ICD-10-CM | POA: Diagnosis not present

## 2022-12-22 MED ORDER — CETIRIZINE HCL 10 MG PO TABS: 10.0000 mg | ORAL_TABLET | Freq: Every day | ORAL | 0 refills | Status: DC

## 2022-12-22 MED ORDER — PREDNISONE 20 MG PO TABS: ORAL_TABLET | ORAL | 0 refills | Status: DC

## 2022-12-22 MED ORDER — AMOXICILLIN-POT CLAVULANATE 875-125 MG PO TABS: 1.0000 | ORAL_TABLET | Freq: Two times a day (BID) | ORAL | 0 refills | Status: DC

## 2022-12-22 NOTE — Discharge Instructions (Signed)
You have sinobronchitis and therefore you need to take Augmentin and prednisone to help with the infection and inflammation. Use Zyrtec, Tylenol for supportive care.

## 2022-12-22 NOTE — ED Provider Notes (Signed)
[facility]  Note:  This document was prepared using Conservation officer, historic buildings and may include unintentional dictation errors.  MRN: [id number] DOB: 08-30-2000  Subjective:   James Phillips is a 22 y.o. male presenting for 2 week history of persistent sinus congestion, chest congestion, productive cough, chest pain and shob with coughing. Has a history of childhood asthma, has not needed an inhaler for some time. No smoking of any kind including cigarettes, cigars, vaping, marijuana use.    No current facility-administered medications for this encounter.  Current Outpatient Medications:    diclofenac Sodium (VOLTAREN) 1 % GEL, Apply 2 g topically 4 (four) times daily., Disp: 150 g, Rfl: 0   dicyclomine (BENTYL) 10 MG capsule, Take 1 capsule (10 mg total) by mouth 3 (three) times daily as needed for spasms., Disp: 30 capsule, Rfl: 1   fluticasone (FLONASE) 50 MCG/ACT nasal spray, Place 2 sprays into both nostrils daily., Disp: 16 g, Rfl: 6   promethazine-dextromethorphan (PROMETHAZINE-DM) 6.25-15 MG/5ML syrup, Take 5 mLs by mouth 4 (four) times daily as needed for cough., Disp: 118 mL, Rfl: 0   Allergies  Allergen Reactions   Copper-Containing Compounds     Past Medical History:  Diagnosis Date   Allergy    Anal fissure    Anxiety      Past Surgical History:  Procedure Laterality Date   WISDOM TOOTH EXTRACTION      Family History  Problem Relation Age of Onset   Diabetes Father    Sleep apnea Father    Asthma Brother    Colon cancer Neg Hx    Stomach cancer Neg Hx    Esophageal cancer Neg Hx    Pancreatic cancer Neg Hx    Rectal cancer Neg Hx     Social History   Tobacco Use   Smoking status: Never   Smokeless tobacco: Never  Vaping Use   Vaping status: Never Used  Substance Use Topics   Alcohol use: Yes    Comment: occ   Drug use: Not Currently    Types: Marijuana    ROS   Objective:   Vitals: BP (!)  144/82 (BP Location: Right Arm)   Pulse 75   Temp 99 F (37.2 C) (Oral)   Resp 16   SpO2 95%   Physical Exam Constitutional:      General: He is not in acute distress.    Appearance: Normal appearance. He is well-developed and normal weight. He is not ill-appearing, toxic-appearing or diaphoretic.  HENT:     Head: Normocephalic and atraumatic.     Right Ear: Tympanic membrane, ear canal and external ear normal. No drainage, swelling or tenderness. No middle ear effusion. There is no impacted cerumen. Tympanic membrane is not erythematous or bulging.     Left Ear: Tympanic membrane, ear canal and external ear normal. No drainage, swelling or tenderness.  No middle ear effusion. There is no impacted cerumen. Tympanic membrane is not erythematous or bulging.     Nose: Nose normal. No congestion or rhinorrhea.     Mouth/Throat:     Mouth: Mucous membranes are moist.     Pharynx: No pharyngeal swelling, oropharyngeal exudate, posterior oropharyngeal erythema or uvula swelling.     Tonsils: No tonsillar exudate or tonsillar abscesses. 0 on the right. 0 on the left.  Eyes:     General: No scleral icterus.       Right eye: No discharge.  Left eye: No discharge.     Extraocular Movements: Extraocular movements intact.     Conjunctiva/sclera: Conjunctivae normal.  Cardiovascular:     Rate and Rhythm: Normal rate and regular rhythm.     Heart sounds: Normal heart sounds. No murmur heard.    No friction rub. No gallop.  Pulmonary:     Effort: Pulmonary effort is normal. No respiratory distress.     Breath sounds: Normal breath sounds. No stridor. No wheezing, rhonchi or rales.  Musculoskeletal:     Cervical back: Normal range of motion and neck supple. No rigidity. No muscular tenderness.  Neurological:     General: No focal deficit present.     Mental Status: He is alert and oriented to person, place, and time.  Psychiatric:        Mood and Affect: Mood normal.        Behavior:  Behavior normal.        Thought Content: Thought content normal.        Judgment: Judgment normal.     Assessment and Plan :   PDMP not reviewed this encounter.  1. Sinobronchitis    Will address sinobronchitis with Augmentin and prednisone. Use supportive care. Will defer imaging for now. Counseled patient on potential for adverse effects with medications prescribed/recommended today, ER and return-to-clinic precautions discussed, patient verbalized understanding.    Wallis Bamberg, [place] 12/22/22 1546

## 2022-12-22 NOTE — ED Triage Notes (Signed)
Pt c/o sore throat, fatigue, cough, head/chest congestion x 2 weeks-seen here last week for same-NAD-steady gait

## 2022-12-24 ENCOUNTER — Ambulatory Visit (INDEPENDENT_AMBULATORY_CARE_PROVIDER_SITE_OTHER): Payer: Medicaid Other | Admitting: Student

## 2022-12-24 ENCOUNTER — Encounter: Payer: Self-pay | Admitting: Student

## 2022-12-24 VITALS — BP 98/56 | HR 70 | Wt 210.0 lb

## 2022-12-24 DIAGNOSIS — J029 Acute pharyngitis, unspecified: Secondary | ICD-10-CM

## 2022-12-24 DIAGNOSIS — R21 Rash and other nonspecific skin eruption: Secondary | ICD-10-CM

## 2022-12-24 MED ORDER — CLOTRIMAZOLE 1 % EX CREA: 1.0000 | TOPICAL_CREAM | Freq: Two times a day (BID) | CUTANEOUS | 0 refills | Status: DC

## 2022-12-24 NOTE — Assessment & Plan Note (Signed)
Patient comes in for follow-up of sore throat, that is improving.  Patient notes that he has been sick since start of October, with what felt like a virus, but continued to be sick, and never really improved.  Patient went to urgent care 2 days ago received antibiotics, and is now feeling better.  Reports sore throat is improving, no longer having fevers, and reports some fatigue.  Patient most likely had protracted course of virus or postviral bacterial infection.  Patient appears well, and seems to have turned a corner and will encourage him to continue antibiotics and follow-up as needed.  Patient with concern for drainage causing sore throat, if symptoms persist may need allergy regimen for postnasal drip. - Continue antibiotics - Follow-up as needed

## 2022-12-24 NOTE — Progress Notes (Signed)
  SUBJECTIVE:   CHIEF COMPLAINT / HPI:   Sore Throat Had gotten sick at the start of October, and didn't get beter. Went to UC two days ago, and got abx, is now feeling better. Sore throat better today. Just wanting to be checked out. Appreciates no longer having fever's, and is improving since being on abx.  Appreciating a lot of mucus and drainage when symptoms first started  Rash Comes and goes, puts aquaphor on it, itches and spreads sometimes. Has had it for a while.  PERTINENT  PMH / PSH:    OBJECTIVE:  BP (!) 98/56   Pulse 70   Wt 210 lb (95.3 kg)   SpO2 98%   BMI 31.01 kg/m  Physical Exam Constitutional:      General: He is not in acute distress.    Appearance: Normal appearance. He is not ill-appearing.  HENT:     Right Ear: Tympanic membrane, ear canal and external ear normal.     Left Ear: Tympanic membrane, ear canal and external ear normal.     Nose: No congestion or rhinorrhea.     Mouth/Throat:     Mouth: Mucous membranes are moist.     Pharynx: Oropharynx is clear. No oropharyngeal exudate or posterior oropharyngeal erythema.  Cardiovascular:     Rate and Rhythm: Normal rate and regular rhythm.     Pulses: Normal pulses.     Heart sounds: Normal heart sounds. No murmur heard.    No friction rub. No gallop.  Pulmonary:     Effort: Pulmonary effort is normal. No respiratory distress.     Breath sounds: Normal breath sounds. No stridor. No wheezing, rhonchi or rales.  Neurological:     Mental Status: He is alert.       ASSESSMENT/PLAN:  Sore throat Assessment & Plan: Patient comes in for follow-up of sore throat, that is improving.  Patient notes that he has been sick since start of October, with what felt like a virus, but continued to be sick, and never really improved.  Patient went to urgent care 2 days ago received antibiotics, and is now feeling better.  Reports sore throat is improving, no longer having fevers, and reports some fatigue.  Patient most  likely had protracted course of virus or postviral bacterial infection.  Patient appears well, and seems to have turned a corner and will encourage him to continue antibiotics and follow-up as needed.  Patient with concern for drainage causing sore throat, if symptoms persist may need allergy regimen for postnasal drip. - Continue antibiotics - Follow-up as needed   Rash Assessment & Plan: Patient appreciates the rash she has had for a while, itchy, spreads, sometimes resolves, has used Aquaphor for without relief.  Rash appears papular with raised macules of hyperpigmentation concerning for tinea corpus.  Will try antifungal. - Clotrimazole twice daily - Follow-up if not improving in 1 week   Other orders -     Clotrimazole; Apply 1 Application topically 2 (two) times daily.  Dispense: 60 g; Refill: 0   No follow-ups on file. Bess Kinds, MD 12/24/2022, 5:02 PM PGY-3, [facility]

## 2022-12-24 NOTE — Assessment & Plan Note (Signed)
Patient appreciates the rash she has had for a while, itchy, spreads, sometimes resolves, has used Aquaphor for without relief.  Rash appears papular with raised macules of hyperpigmentation concerning for tinea corpus.  Will try antifungal. - Clotrimazole twice daily - Follow-up if not improving in 1 week

## 2022-12-24 NOTE — Patient Instructions (Addendum)
It was great to see you! Thank you for allowing me to participate in your care!   Our plans for today:  - Sore Throat You may have had a bacterial infection, after a viral infection. It looks like the antibiotics are helping. Continue to take them until complete.   Follow up if sore throat persist in 2 weeks, may have allergy component of post nasal drip.   - Back Rash Looks fungal, we will try an antifungal cream. Can take 2-4 weeks to completely resolve, don't scratch. Follow up in 1 week if not getting better/getting worse.   Clotrimazole two be used twice a day, after cleaning back    Take care and seek immediate care sooner if you develop any concerns.   Dr. Bess Kinds, MD [facility]

## 2023-01-12 NOTE — Telephone Encounter (Signed)
I called and spoke with the patient he is scheduled at [facility] for 02/13/23 at 9 pm.  Mailed packet to the patient, will also redo the authorization because it has expired.

## 2023-01-13 NOTE — Progress Notes (Signed)
    SUBJECTIVE:   CHIEF COMPLAINT / HPI:   Left knee pain ongoing x 2 weeks States that 2 weeks ago, he was changing his tire and kneeled down onto the ground when it started hurting Pain is primarily on the medial aspect of the left knee When the pain first started, he noticed some mild swelling but this has since improved Denies any twisting or popping, no locking of the knee, no injury otherwise.  Has never needed surgery on the knee.  Right knee is fine  PERTINENT  PMH / PSH: N/A  OBJECTIVE:   BP 114/60   Pulse 66   Ht 5\' 9"  (1.753 m)   Wt 213 lb 6 oz (96.8 kg)   BMI 31.51 kg/m   General: NAD, pleasant, able to participate in exam Respiratory: No respiratory distress Skin: warm and dry, no rashes noted Psych: Normal affect and mood  L knee: Inspection: No gross deformity, ecchymoses, swelling Palpation: Mild tenderness to palpation over medial joint line.  Otherwise nontender ROM: Full range of motion without pain Strength: 5/5 strength Special tests: Negative Apley's, negative McMurray's, negative anterior/posterior drawers.  Mild pain medially with valgus stress, no pain with varus stressing  ASSESSMENT/PLAN:   Assessment & Plan Left knee pain, unspecified chronicity Low suspicion for acute meniscal or ligamentous tear with overall benign exam. May have had a mild contusion but do not feel that imaging is warranted at this time. Recommended consistent ibuprofen/naproxen use over the next 3-5 days with compression therapy and icing. Discussed supportive care and return precautions.    Vonna Drafts, MD [facility]

## 2023-01-14 ENCOUNTER — Ambulatory Visit (INDEPENDENT_AMBULATORY_CARE_PROVIDER_SITE_OTHER): Payer: Medicaid Other | Admitting: Family Medicine

## 2023-01-14 ENCOUNTER — Encounter: Payer: Self-pay | Admitting: Family Medicine

## 2023-01-14 VITALS — BP 114/60 | HR 66 | Ht 69.0 in | Wt 213.4 lb

## 2023-01-14 DIAGNOSIS — M25562 Pain in left knee: Secondary | ICD-10-CM | POA: Diagnosis present

## 2023-01-14 NOTE — Assessment & Plan Note (Signed)
Low suspicion for acute meniscal or ligamentous tear with overall benign exam. May have had a mild contusion but do not feel that imaging is warranted at this time. Recommended consistent ibuprofen/naproxen use over the next 3-5 days with compression therapy and icing. Discussed supportive care and return precautions.

## 2023-01-14 NOTE — Patient Instructions (Signed)
Please take naproxen twice daily for the next 3-5 days and use a compression sleeve

## 2023-01-18 NOTE — Telephone Encounter (Signed)
[employer] [facility] pending new auth

## 2023-01-24 NOTE — Telephone Encounter (Signed)
Checked status on the portal it is still pending.  

## 2023-01-24 NOTE — Telephone Encounter (Signed)
[facility]- [facility]: [id number] (exp. 1112/24 to 03/08/23)   Patient is scheduled at [facility] for 02/13/23.

## 2023-02-13 ENCOUNTER — Ambulatory Visit (INDEPENDENT_AMBULATORY_CARE_PROVIDER_SITE_OTHER): Payer: Medicaid Other | Admitting: Neurology

## 2023-02-13 DIAGNOSIS — Z9189 Other specified personal risk factors, not elsewhere classified: Secondary | ICD-10-CM

## 2023-02-13 DIAGNOSIS — Z820 Family history of epilepsy and other diseases of the nervous system: Secondary | ICD-10-CM

## 2023-02-13 DIAGNOSIS — G4720 Circadian rhythm sleep disorder, unspecified type: Secondary | ICD-10-CM

## 2023-02-13 DIAGNOSIS — R519 Headache, unspecified: Secondary | ICD-10-CM

## 2023-02-13 DIAGNOSIS — G4719 Other hypersomnia: Secondary | ICD-10-CM

## 2023-02-13 DIAGNOSIS — R635 Abnormal weight gain: Secondary | ICD-10-CM

## 2023-02-13 DIAGNOSIS — R0683 Snoring: Secondary | ICD-10-CM

## 2023-02-13 DIAGNOSIS — E66811 Obesity, class 1: Secondary | ICD-10-CM

## 2023-02-14 NOTE — Procedures (Unsigned)
Physician Interpretation:    Referred by: Tiffany Kocher, DO    History and Indication for Testing: 22 year old male with an underlying medical history of low back pain, varicocele, and mild obesity, who reports snoring and a family history of sleep apnea affecting his father who has a CPAP machine. His Epworth sleepiness score is 5 out of 24, fatigue severity score is 21 out of 63.    EEG: Review of the EEG showed no abnormal electrical discharges and symmetrical bihemispheric findings.     EKG: The EKG revealed normal sinus rhythm (NSR). ***   AUDIO/VIDEO REVIEW: The audio and video review did not show any abnormal or unusual behaviors, movements, phonations or vocalizations. The patient took *** restroom breaks. Snoring was noted, ***   POST-STUDY QUESTIONNAIRE: Post study, the patient indicated, that sleep was *** the same as usual.    IMPRESSION:   ***Obstructive Sleep Apnea (OSA) ***Central Sleep Apnea (CSA) ***Primary Snoring ***Primary Central Sleep Apnea ***Complex Sleep Apnea ***PLMD (periodic limb movement disorder [of sleep]) ***Dysfunctions associated with sleep stages or arousal from sleep ***Non-specific abnormal electrocardiogram (EKG) ***Poor sleep pattern ***Inconclusive Test   RECOMMENDATIONS:        I certify that I have reviewed the entire raw data recording prior to the issuance of this report in accordance with the Standards of Accreditation of the American Academy of Sleep Medicine (AASM).   Huston Foley, MD, PhD Medical Director, [facility]) Diplomat, ABPN (Neurology and Sleep)   Technical Report:   ***  Titration Table:  ***

## 2023-04-05 ENCOUNTER — Ambulatory Visit
Admission: EM | Admit: 2023-04-05 | Discharge: 2023-04-05 | Disposition: A | Discharge status: Home / Self Care | Payer: Medicaid Other | Attending: Family Medicine | Admitting: Family Medicine

## 2023-04-05 DIAGNOSIS — M545 Low back pain, unspecified: Secondary | ICD-10-CM

## 2023-04-05 DIAGNOSIS — M436 Torticollis: Secondary | ICD-10-CM

## 2023-04-05 MED ORDER — CYCLOBENZAPRINE HCL 5 MG PO TABS: 5.0000 mg | ORAL_TABLET | Freq: Every evening | ORAL | 0 refills | Status: DC | PRN

## 2023-04-05 MED ORDER — PREDNISONE 20 MG PO TABS: ORAL_TABLET | ORAL | 0 refills | Status: DC

## 2023-04-05 NOTE — ED Provider Notes (Signed)
[facility] - URGENT CARE CENTER  Note:  This document was prepared using Conservation officer, historic buildings and may include unintentional dictation errors.  MRN: [id number] DOB: 2000/12/25  Subjective:   James Phillips is a 23 y.o. male presenting for 3-day history of persistent right-sided neck pain with stiffness and radiation to the trapezius.  Has had similar symptoms over the right low back.  No fall, trauma, numbness or tingling, saddle paresthesia, changes to bowel or urinary habits, radicular symptoms.  No hematuria, history of renal colic.  No heavy lifting.  Patient has a sedentary job.  Tries to hydrate with 4 bottles of water daily.  No current facility-administered medications for this encounter.  Current Outpatient Medications:    ibuprofen (ADVIL) 200 MG tablet, Take 200 mg by mouth every 6 (six) hours as needed., Disp: , Rfl:    amoxicillin-clavulanate (AUGMENTIN) 875-125 MG tablet, Take 1 tablet by mouth 2 (two) times daily., Disp: 14 tablet, Rfl: 0   cetirizine (ZYRTEC ALLERGY) 10 MG tablet, Take 1 tablet (10 mg total) by mouth daily., Disp: 30 tablet, Rfl: 0   clotrimazole (LOTRIMIN) 1 % cream, Apply 1 Application topically 2 (two) times daily., Disp: 60 g, Rfl: 0   diclofenac Sodium (VOLTAREN) 1 % GEL, Apply 2 g topically 4 (four) times daily., Disp: 150 g, Rfl: 0   dicyclomine (BENTYL) 10 MG capsule, Take 1 capsule (10 mg total) by mouth 3 (three) times daily as needed for spasms., Disp: 30 capsule, Rfl: 1   fluticasone (FLONASE) 50 MCG/ACT nasal spray, Place 2 sprays into both nostrils daily., Disp: 16 g, Rfl: 6   predniSONE (DELTASONE) 20 MG tablet, Take 2 tablets daily with breakfast., Disp: 10 tablet, Rfl: 0   promethazine-dextromethorphan (PROMETHAZINE-DM) 6.25-15 MG/5ML syrup, Take 5 mLs by mouth 4 (four) times daily as needed for cough., Disp: 118 mL, Rfl: 0   Allergies  Allergen Reactions   Copper-Containing Compounds     Past Medical History:   Diagnosis Date   Allergy    Anal fissure    Anxiety      Past Surgical History:  Procedure Laterality Date   WISDOM TOOTH EXTRACTION      Family History  Problem Relation Age of Onset   Diabetes Father    Sleep apnea Father    Asthma Brother    Colon cancer Neg Hx    Stomach cancer Neg Hx    Esophageal cancer Neg Hx    Pancreatic cancer Neg Hx    Rectal cancer Neg Hx     Social History   Tobacco Use   Smoking status: Never    Passive exposure: Never   Smokeless tobacco: Never  Vaping Use   Vaping status: Never Used  Substance Use Topics   Alcohol use: Yes    Comment: occ   Drug use: Not Currently    Types: Marijuana    ROS   Objective:   Vitals: BP 129/84 (BP Location: Right Arm)   Pulse 88   Temp 98.2 F (36.8 C) (Oral)   Resp 16   SpO2 96%   Physical Exam Constitutional:      General: He is not in acute distress.    Appearance: Normal appearance. He is well-developed and normal weight. He is not ill-appearing, toxic-appearing or diaphoretic.  HENT:     Head: Normocephalic and atraumatic.     Right Ear: External ear normal.     Left Ear: External ear normal.     Nose: Nose normal.  Mouth/Throat:     Pharynx: Oropharynx is clear.  Eyes:     General: No scleral icterus.       Right eye: No discharge.        Left eye: No discharge.     Extraocular Movements: Extraocular movements intact.  Cardiovascular:     Rate and Rhythm: Normal rate.  Pulmonary:     Effort: Pulmonary effort is normal.  Musculoskeletal:     Cervical back: Normal range of motion. Spasms, torticollis and tenderness (right sided) present. No swelling, edema, deformity, erythema, signs of trauma, lacerations, rigidity, bony tenderness or crepitus. Pain with movement present. Normal range of motion.     Lumbar back: Spasms and tenderness present. No swelling, edema, deformity, signs of trauma, lacerations or bony tenderness. Normal range of motion. Negative right straight leg  raise test and negative left straight leg raise test. No scoliosis.       Back:  Neurological:     Mental Status: He is alert and oriented to person, place, and time.  Psychiatric:        Mood and Affect: Mood normal.        Behavior: Behavior normal.        Thought Content: Thought content normal.        Judgment: Judgment normal.     Assessment and Plan :   PDMP not reviewed this encounter.  1. Torticollis   2. Acute right-sided low back pain without sciatica    Given severity of his pain and lack of response with NSAID, offered an oral prednisone course.  Use a muscle relaxant.  This is an effort to address torticollis, inflammatory back pain, possible lumbar and/or cervical strain.  Will defer imaging given lack of trauma, low suspicion for fracture. Counseled patient on potential for adverse effects with medications prescribed/recommended today, ER and return-to-clinic precautions discussed, patient verbalized understanding.    Wallis Bamberg, [place] 04/05/23 [phone]

## 2023-04-05 NOTE — ED Triage Notes (Signed)
Pt reports neck pain when moving his head a dn right sided back pain x 3 days. Pt took 1 dose ibuprofen today without relief

## 2023-04-21 ENCOUNTER — Encounter: Payer: Self-pay | Admitting: Student

## 2023-04-21 ENCOUNTER — Ambulatory Visit: Payer: Medicaid Other | Admitting: Student

## 2023-04-21 VITALS — BP 110/75 | HR 80 | Ht 69.0 in | Wt 224.4 lb

## 2023-04-21 DIAGNOSIS — B354 Tinea corporis: Secondary | ICD-10-CM

## 2023-04-21 MED ORDER — CLOTRIMAZOLE 1 % EX CREA: 1.0000 | TOPICAL_CREAM | Freq: Two times a day (BID) | CUTANEOUS | 0 refills | Status: DC

## 2023-04-21 NOTE — Progress Notes (Signed)
    SUBJECTIVE:   CHIEF COMPLAINT / HPI:   Rash Rash developed 1 week ago on left arm.  Reports pet cat has fungal skin infection, which she is taking for that for.  Rash is pruritic.  Not tender to touch and denies drainage.  He has been using OTC fungal cream with some improvement.  Rash is very itchy.  It has spread to his left ankle and left thigh, lesions are similar in appearance but smaller than 1 on the left arm.  Denies fever, chills, night sweats, NVD.   OBJECTIVE:   BP 110/75   Pulse 80   Ht 5\' 9"  (1.753 m)   Wt 224 lb 6.4 oz (101.8 kg)   SpO2 97%   BMI 33.14 kg/m    General: NAD, pleasant Cardio: RRR, no MRG. Cap Refill <2s. Respiratory: CTAB, normal wob on RA Skin: ~1.5 cm x 1 cm lesion, erythematous border with central clearing. Not tender, not weeping. See below.   ASSESSMENT/PLAN:   Assessment & Plan Tinea corporis Highly suspect tinea infection.  Given use of OTC antifungal, will not great today. - Trial clotrimazole 1% twice daily for 1 to 2 weeks - Follow-up if symptoms fail to resolve  Tiffany Kocher, DO [facility]

## 2023-04-21 NOTE — Patient Instructions (Addendum)
It was great to see you! Thank you for allowing me to participate in your care!   I recommend that you always bring your medications to each appointment as this makes it easy to ensure we are on the correct medications and helps [place] not miss when refills are needed.  Our plans for today:  - Use Clotrimazole cream twice daily for 1-2 weeks until resolution  Take care and seek immediate care sooner if you develop any concerns. Please remember to show up 15 minutes before your scheduled appointment time!  Tiffany Kocher, DO [facility]

## 2023-06-08 ENCOUNTER — Ambulatory Visit: Admitting: Student

## 2023-06-10 ENCOUNTER — Other Ambulatory Visit (HOSPITAL_COMMUNITY)
Admission: RE | Admit: 2023-06-10 | Discharge: 2023-06-10 | Disposition: A | Discharge status: Home / Self Care | Source: Ambulatory Visit | Attending: Family Medicine | Admitting: Family Medicine

## 2023-06-10 ENCOUNTER — Ambulatory Visit (INDEPENDENT_AMBULATORY_CARE_PROVIDER_SITE_OTHER): Admitting: Student

## 2023-06-10 VITALS — BP 112/77 | HR 77 | Temp 98.4°F | Ht 69.0 in | Wt 215.4 lb

## 2023-06-10 DIAGNOSIS — J029 Acute pharyngitis, unspecified: Secondary | ICD-10-CM | POA: Diagnosis present

## 2023-06-10 MED ORDER — DEXAMETHASONE 4 MG PO TABS
4.0000 mg | ORAL_TABLET | Freq: Every day | ORAL | 0 refills | Status: AC
Start: 2023-06-10 — End: 2023-06-12

## 2023-06-10 NOTE — Patient Instructions (Addendum)
 Pleasure to meet you today.  Your symptoms are more consistent with ulcer of your tonsils.    Could be possible due to viral illness, stress or dietary insufficiency.  I have sent in prescription for steroid which you take once daily for 3 days.  We collected a throat swab sample to test for bacterial infection.

## 2023-06-10 NOTE — Progress Notes (Signed)
    SUBJECTIVE:   CHIEF COMPLAINT / HPI:   James 23 year old Phillips presents with a one-week history of a sore throat, described as a sharp pain in the throat, particularly when swallowing or talking. The pain is localized to the anterior neck, and is exacerbated by eating, drinking, or any movement of the tongue. The patient denies any associated fever, chills, body aches, cough, or runny nose. He report that over-the-counter Tylenol provides some relief, but the pain persists.  The patient had a similar episode of sore throat last year, but they report that the current symptoms are different. He also had a tooth extraction on the same side of the mouth two weeks ago, but do not believe it is related to the current symptoms.  The patient is sexually active with their fiancee and engages in oral sex. He deny any metallic taste in the mouth or symptoms of heartburn.   PERTINENT  PMH / PSH: Reviewed   OBJECTIVE:   BP 112/77   Pulse 77   Temp 98.4 F (36.9 C)   Ht 5\' 9"  (1.753 m)   Wt 215 lb 6.4 oz (97.7 kg)   SpO2 99%   BMI 31.81 kg/m     Physical Exam General: Alert, well appearing, NAD HEENT: Well-circumscribed ulcer on the right tonsil, tonsils size bilaterally +2.  No oropharyngeal erythema or tonsillar exudate. Cardiovascular: RRR, No Murmurs, Normal S2/S2 Respiratory: Good work of breathing on RA  ASSESSMENT/PLAN:   Sore throat Persistent sore throat with sharp pain, no systemic symptoms.  Verbal tonsil ulcer on exam suspect possible viral process vs aphthous ulcer - Perform throat examination. - Throat swab for GC/chlamydia - Rx dexamethasone 4 mg x 2 dose - Recommend continued use of Tylenol for analgesic  Jerre Simon, MD [facility]

## 2023-06-13 LAB — CERVICOVAGINAL ANCILLARY ONLY
Chlamydia: NEGATIVE
Comment: NEGATIVE
Comment: NORMAL
Neisseria Gonorrhea: NEGATIVE

## 2023-06-14 ENCOUNTER — Encounter: Payer: Self-pay | Admitting: Student

## 2023-07-29 ENCOUNTER — Ambulatory Visit: Admitting: Student

## 2023-07-29 ENCOUNTER — Ambulatory Visit (INDEPENDENT_AMBULATORY_CARE_PROVIDER_SITE_OTHER): Admitting: Student

## 2023-07-29 VITALS — BP 104/62 | HR 87 | Ht 69.0 in | Wt 216.2 lb

## 2023-07-29 DIAGNOSIS — R21 Rash and other nonspecific skin eruption: Secondary | ICD-10-CM | POA: Diagnosis present

## 2023-07-29 MED ORDER — TRIAMCINOLONE ACETONIDE 0.1 % EX OINT
1.0000 | TOPICAL_OINTMENT | Freq: Two times a day (BID) | CUTANEOUS | 0 refills | Status: DC
Start: 2023-07-29 — End: 2023-09-15

## 2023-07-29 NOTE — Patient Instructions (Signed)
 It was great to see you! Thank you for allowing me to participate in your care!    Our plans for today:  - Triamcinolone sent to pharmacy for rash. If no improvement in the next week or two, please return   Take care and seek immediate care sooner if you develop any concerns.   Dr. Glenn Lange, DO [facility]

## 2023-07-29 NOTE — Progress Notes (Signed)
    SUBJECTIVE:   CHIEF COMPLAINT / HPI:   The patient presents with a rash on his back. The rash on his back has been present for at least two weeks and is mildly pruritic, especially when scratched. There are no new products, soaps, or detergents that could have triggered the rash. He denies associated symptoms such as fever, malaise, body aches, or arthralgia. A similar rash has been noted on his ankle and chest previously. The rash has not changed in size or spread.   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 Differential includes keratosis pilaris (typically would also be on extensor surfaces of the arms), contact dermatitis (odd location for contact dermatitis), pityrosporum folliculitis (would expect pustular lesions and would not expect this to migrate around the body).  - Prescribed triamcinolone ointment twice daily.  - Advised use of Aquaphor or Vaseline between applications. - Instructed to avoid triamcinolone on face or groin and limit use to two weeks.  -Return if rash worsens or does not improve over the next week or 2     Dr. Glenn Lange, DO [facility]

## 2023-07-29 NOTE — Assessment & Plan Note (Signed)
 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 Differential includes keratosis pilaris (typically would also be on extensor surfaces of the arms), contact dermatitis (odd location for contact dermatitis), pityrosporum folliculitis (would expect pustular lesions and would not expect this to migrate around the body).  - Prescribed triamcinolone ointment twice daily.  - Advised use of Aquaphor or Vaseline between applications. - Instructed to avoid triamcinolone on face or groin and limit use to two weeks.  -Return if rash worsens or does not improve over the next week or 2

## 2023-09-15 ENCOUNTER — Ambulatory Visit

## 2023-09-15 VITALS — BP 114/80 | HR 73 | Ht 70.0 in | Wt 216.4 lb

## 2023-09-15 DIAGNOSIS — H9201 Otalgia, right ear: Secondary | ICD-10-CM | POA: Diagnosis present

## 2023-09-15 DIAGNOSIS — H9121 Sudden idiopathic hearing loss, right ear: Secondary | ICD-10-CM | POA: Diagnosis not present

## 2023-09-15 NOTE — Progress Notes (Signed)
    SUBJECTIVE:   CHIEF COMPLAINT / HPI:   Ear pain/Hearing loss: Reports that starting Monday he tried his girlfriends headphones, and the sound quality was very poor.  States that he is felt a sharp pain from the inside of his ear.  He irrigated both ears with a home irrigation set and did not notice any improvement.  He does have a history of cerumen impaction as a child as well as allergies, which he said have largely resolved.  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  No fever, chills, cough, nasal congestion, postnasal drip, nausea, or shortness of breath.  Does report occasional brief episodes of tinnitus.  Health maintenance: Patient says he is up-to-date with the vaccines, but records are not in the system.  I encouraged patient to bring records next visit.  PERTINENT  PMH / PSH: none  OBJECTIVE:   BP 119/81   Pulse 73   Ht 5' 10 (1.778 m)   Wt 216 lb 6 oz (98.1 kg)   SpO2 100%   BMI 31.05 kg/m    Ears: No mastoid tenderness, ear canal clear with no erythema or discharge, tympanic membrane intact and translucent with cone of light.   Weber test: was normal with midline hearing. Rinne test: air conduction greater than bone conduction bilaterally. Cardiac: Regular rate and rhythm. Normal S1/S2. No murmurs, rubs, or gallops appreciated. Lungs: Clear bilaterally to ascultation.  Psych: Pleasant and appropriate    ASSESSMENT/PLAN:   Assessment & Plan Ear pain, right Referral to see an audiologist for further evaluation.  Encouraged patient to restart Flonase  for the meantime in case this was due to a eustachian tube defect/congestion. 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     Fairy Amy, MD [facility]

## 2023-09-15 NOTE — Patient Instructions (Signed)
 Thank you for visiting the clinic today, it was good to see you! In today's visit we discussed:  Ear pain/hearing loss: Our tests in the clinic today were normal or possibly suggestive of a mild sensorineural hearing loss. We think this may be related to poor drainage of the tube in your ear, so restart your flonase . We will also put in a referral to see an audiologist.  Please follow-up if symptoms change or worsen  For any questions, please call the office at [phone] or send me a message in MyChart. Have a great day!  -Fairy Amy, MD  [facility] Resident, PGY-1

## 2023-10-03 ENCOUNTER — Ambulatory Visit: Attending: Family Medicine | Admitting: Audiologist

## 2023-10-03 DIAGNOSIS — H9311 Tinnitus, right ear: Secondary | ICD-10-CM | POA: Insufficient documentation

## 2023-10-03 DIAGNOSIS — H9191 Unspecified hearing loss, right ear: Secondary | ICD-10-CM | POA: Diagnosis present

## 2023-10-03 NOTE — Procedures (Signed)
  Outpatient Audiology and [facility] [address] [place], [place]  [address] [phone]  AUDIOLOGICAL  EVALUATION  NAME: James Phillips     DOB:   03-13-2000      MRN: [id number]                                                                                     DATE: 10/03/2023     REFERENT: Howell Lunger, DO STATUS: Outpatient DIAGNOSIS: Tinnitus Right Ear, Decreased Hearing Right Ear   History: Andras was seen for an audiological evaluation due to episodes of pain, wooshing, and hearing loss in the right ear. Several times a week he has episodes where the hearing in his right ear goes away, he has pain, and there is a loud ringing. He has a constant woosh sound in the right ear. This has been going on for months, but has been getting worse. He is not having an episode now.  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  Medical history shows no additional risk for hearing loss.    Evaluation:  Otoscopy showed a clear view of the tympanic membranes, bilaterally Tympanometry results were consistent with normal middle ear function, bilaterally   Audiometric testing was completed using Conventional Audiometry techniques with insert earphones and supraural headphones. Test results are consistent with normal hearing bilaterally. Speech Recognition Thresholds were obtained at 10dB HL in the right ear and at 15dB HL in the left ear. Word Recognition Testing was completed at 55dB HL and [facility] scored 100% in each ear   Results:  The test results were reviewed with [facility]. He has normal hearing. He as normal middle ear function. No indications of damage to the right ear that appears on the hearing testing.  Audiogram printed and provided to [place].    Recommendations: Recommend referral to Otolaryngology due to episodes of unilateral pain and hearing loss, and constant tinnitus in right ear.     30 minutes spent testing and counseling on results.   If you have any questions please feel free to contact me at [phone].  Lauraine Ka Stalnaker Au.D.  Audiologist   10/03/2023  8:43 AM  Cc: Howell Lunger, DO
